# Patient Record
Sex: Female | Born: 1990 | Race: White | Hispanic: No | Marital: Single | Smoking: Never smoker
Health system: Southern US, Community
[De-identification: ages and names within clinical notes are randomized; demographics above are authoritative.]

## PROBLEM LIST (undated history)

## (undated) DIAGNOSIS — F419 Anxiety disorder, unspecified: Secondary | ICD-10-CM

## (undated) DIAGNOSIS — R519 Headache, unspecified: Secondary | ICD-10-CM

## (undated) DIAGNOSIS — F99 Mental disorder, not otherwise specified: Secondary | ICD-10-CM

## (undated) DIAGNOSIS — K5900 Constipation, unspecified: Secondary | ICD-10-CM

## (undated) HISTORY — DX: Anxiety disorder, unspecified: F41.9

## (undated) HISTORY — DX: Mental disorder, not otherwise specified: F99

## (undated) HISTORY — DX: Headache, unspecified: R51.9

---

## 2011-01-15 DIAGNOSIS — Z0000 Encounter for general adult medical examination without abnormal findings: Secondary | ICD-10-CM | POA: Insufficient documentation

## 2012-01-29 DIAGNOSIS — K219 Gastro-esophageal reflux disease without esophagitis: Secondary | ICD-10-CM | POA: Insufficient documentation

## 2013-11-20 DIAGNOSIS — L709 Acne, unspecified: Secondary | ICD-10-CM | POA: Insufficient documentation

## 2019-07-14 ENCOUNTER — Encounter: Payer: Self-pay | Admitting: Obstetrics and Gynecology

## 2020-04-01 ENCOUNTER — Emergency Department: Payer: Self-pay

## 2020-04-01 ENCOUNTER — Observation Stay
Admission: EM | Admit: 2020-04-01 | Discharge: 2020-04-02 | Disposition: A | Discharge status: Home / Self Care | Payer: Self-pay | Attending: Emergency Medicine | Admitting: Emergency Medicine

## 2020-04-01 ENCOUNTER — Other Ambulatory Visit: Payer: Self-pay

## 2020-04-01 DIAGNOSIS — Z20822 Contact with and (suspected) exposure to covid-19: Secondary | ICD-10-CM | POA: Insufficient documentation

## 2020-04-01 DIAGNOSIS — G43901 Migraine, unspecified, not intractable, with status migrainosus: Principal | ICD-10-CM

## 2020-04-01 DIAGNOSIS — F439 Reaction to severe stress, unspecified: Secondary | ICD-10-CM

## 2020-04-01 DIAGNOSIS — R519 Headache, unspecified: Secondary | ICD-10-CM

## 2020-04-01 DIAGNOSIS — R338 Other retention of urine: Secondary | ICD-10-CM

## 2020-04-01 DIAGNOSIS — R339 Retention of urine, unspecified: Secondary | ICD-10-CM | POA: Insufficient documentation

## 2020-04-01 LAB — COMPREHENSIVE METABOLIC PANEL
ALT: 27 U/L (ref 0–44)
AST: 20 U/L (ref 15–41)
Albumin: 4.3 g/dL (ref 3.5–5.0)
Alkaline Phosphatase: 92 U/L (ref 38–126)
Anion gap: 9 (ref 5–15)
BUN: 9 mg/dL (ref 6–20)
CO2: 25 mmol/L (ref 22–32)
Calcium: 9.3 mg/dL (ref 8.9–10.3)
Chloride: 102 mmol/L (ref 98–111)
Creatinine, Ser: 0.83 mg/dL (ref 0.44–1.00)
GFR, Estimated: >60 mL/min (ref >60)
Glucose, Bld: 92 mg/dL (ref 70–99)
Potassium: 3.6 mmol/L (ref 3.5–5.1)
Sodium: 136 mmol/L (ref 135–145)
Total Bilirubin: 0.7 mg/dL (ref 0.3–1.2)
Total Protein: 7.6 g/dL (ref 6.5–8.1)

## 2020-04-01 LAB — URINALYSIS, COMPLETE (UACMP) WITH MICROSCOPIC
Bacteria, UA: NONE SEEN
Bilirubin Urine: NEGATIVE
Glucose, UA: NEGATIVE mg/dL
Hgb urine dipstick: NEGATIVE
Ketones, ur: NEGATIVE mg/dL
Leukocytes,Ua: NEGATIVE
Nitrite: NEGATIVE
Protein, ur: NEGATIVE mg/dL
Specific Gravity, Urine: 1.014 (ref 1.005–1.030)
Squamous Epithelial / HPF: NONE SEEN (ref 0–5)
pH: 6 (ref 5.0–8.0)

## 2020-04-01 LAB — CBC WITH DIFFERENTIAL/PLATELET
Abs Immature Granulocytes: 0.03 10*3/uL (ref 0.00–0.07)
Basophils Absolute: 0 10*3/uL (ref 0.0–0.1)
Basophils Relative: 1 %
Eosinophils Absolute: 0.2 10*3/uL (ref 0.0–0.5)
Eosinophils Relative: 2 %
HCT: 38.4 % (ref 36.0–46.0)
Hemoglobin: 13.2 g/dL (ref 12.0–15.0)
Immature Granulocytes: 0 %
Lymphocytes Relative: 31 %
Lymphs Abs: 2.6 10*3/uL (ref 0.7–4.0)
MCH: 31.6 pg (ref 26.0–34.0)
MCHC: 34.4 g/dL (ref 30.0–36.0)
MCV: 91.9 fL (ref 80.0–100.0)
Monocytes Absolute: 0.8 10*3/uL (ref 0.1–1.0)
Monocytes Relative: 9 %
Neutro Abs: 4.9 10*3/uL (ref 1.7–7.7)
Neutrophils Relative %: 57 %
Platelets: 265 10*3/uL (ref 150–400)
RBC: 4.18 MIL/uL (ref 3.87–5.11)
RDW: 11.9 % (ref 11.5–15.5)
WBC: 8.6 10*3/uL (ref 4.0–10.5)
nRBC: 0 % (ref 0.0–0.2)

## 2020-04-01 LAB — RESP PANEL BY RT PCR (RSV, FLU A&B, COVID)
Influenza A by PCR: NEGATIVE
Influenza B by PCR: NEGATIVE
Respiratory Syncytial Virus by PCR: NEGATIVE
SARS Coronavirus 2 by RT PCR: NEGATIVE

## 2020-04-01 LAB — POCT PREGNANCY, URINE: Preg Test, Ur: NEGATIVE

## 2020-04-01 MED ORDER — SUMATRIPTAN SUCCINATE 50 MG PO TABS
50.0000 mg | ORAL_TABLET | Freq: Two times a day (BID) | ORAL | Status: DC | PRN
  Administered 2020-04-01: 50 mg via ORAL
  Filled 2020-04-01 (×3): qty 1

## 2020-04-01 MED ORDER — KETOROLAC TROMETHAMINE 30 MG/ML IJ SOLN: 30.0000 mg | Freq: Once | INTRAMUSCULAR | Status: DC

## 2020-04-01 MED ORDER — HYDROXYZINE HCL 25 MG PO TABS
50.0000 mg | ORAL_TABLET | Freq: Two times a day (BID) | ORAL | Status: DC | PRN
  Administered 2020-04-01: 50 mg via ORAL
  Filled 2020-04-01: qty 2

## 2020-04-01 MED ORDER — ENOXAPARIN SODIUM 40 MG/0.4ML ~~LOC~~ SOLN
40.0000 mg | SUBCUTANEOUS | Status: DC
  Administered 2020-04-02: 40 mg via SUBCUTANEOUS
  Filled 2020-04-01: qty 0.4

## 2020-04-01 MED ORDER — TRAMADOL HCL 50 MG PO TABS: 50.0000 mg | ORAL_TABLET | Freq: Three times a day (TID) | ORAL | Status: DC | PRN

## 2020-04-01 MED ORDER — DIPHENHYDRAMINE HCL 50 MG/ML IJ SOLN
25.0000 mg | Freq: Once | INTRAMUSCULAR | Status: AC
  Administered 2020-04-01: 25 mg via INTRAVENOUS
  Filled 2020-04-01: qty 1

## 2020-04-01 MED ORDER — ONDANSETRON HCL 4 MG/2ML IJ SOLN
4.0000 mg | Freq: Once | INTRAMUSCULAR | Status: AC
  Administered 2020-04-01: 4 mg via INTRAVENOUS
  Filled 2020-04-01: qty 2

## 2020-04-01 MED ORDER — PROMETHAZINE HCL 25 MG PO TABS
12.5000 mg | ORAL_TABLET | Freq: Four times a day (QID) | ORAL | Status: DC | PRN
  Administered 2020-04-01: 12.5 mg via ORAL
  Filled 2020-04-01: qty 1

## 2020-04-01 MED ORDER — SODIUM CHLORIDE 0.9 % IV BOLUS
500.0000 mL | Freq: Once | INTRAVENOUS | Status: AC
  Administered 2020-04-01: 500 mL via INTRAVENOUS

## 2020-04-01 MED ORDER — KETOROLAC TROMETHAMINE 30 MG/ML IJ SOLN
30.0000 mg | Freq: Once | INTRAMUSCULAR | Status: AC
  Administered 2020-04-01: 30 mg via INTRAVENOUS
  Filled 2020-04-01: qty 1

## 2020-04-01 MED ORDER — SUMATRIPTAN SUCCINATE 6 MG/0.5ML ~~LOC~~ SOLN
6.0000 mg | Freq: Once | SUBCUTANEOUS | Status: AC
  Administered 2020-04-01: 6 mg via SUBCUTANEOUS
  Filled 2020-04-01: qty 0.5

## 2020-04-01 MED ORDER — KETOROLAC TROMETHAMINE 30 MG/ML IJ SOLN
30.0000 mg | Freq: Four times a day (QID) | INTRAMUSCULAR | Status: DC | PRN
  Administered 2020-04-01: 30 mg via INTRAVENOUS
  Filled 2020-04-01 (×2): qty 1

## 2020-04-01 MED ORDER — DEXAMETHASONE SODIUM PHOSPHATE 10 MG/ML IJ SOLN
10.0000 mg | Freq: Once | INTRAMUSCULAR | Status: AC
  Administered 2020-04-01: 10 mg via INTRAVENOUS
  Filled 2020-04-01: qty 1

## 2020-04-01 NOTE — ED Notes (Signed)
BS for 469. Provider verbal Foley placement.

## 2020-04-01 NOTE — H&P (Signed)
History and Physical    Kimberly Hardy VOH:[id number] DOB: 25-Jun-1990 DOA: 04/01/2020  PCP: Patient, No Pcp Per   Patient coming from: Home  I have personally briefly reviewed patient's old medical records in [facility]  Chief Complaint: Headache x2 days, difficulty voiding x1 week, prolonged menstruation HPI: Kimberly Hardy is a 29 y.o. female with no significant past medical history who presents to the emergency room with a 2-day history of headache of moderate severe intensity.  Patient has no prior history of headaches.  Headache is described as spasms that are periodic and severe lasting no more than 2 minutes affecting her entire head, not one-sided and associated with photo and phonophobia.  Has associated nausea.  Has no visual disturbance..  Patient has tried several over-the-counter medications as well as unknown migraine medication without significant improvement.  Gets relief from topical ice packs.   Additionally, patient has been having difficulty voiding associated with lower abdominal pain for the past 1 week and was seen at an emergency room earlier in the week where she was diagnosed with a UTI.  She said those symptoms were preceded by an abnormal menstrual cycle which lasted 9 days whereas her usual cycle lasts just 4 days.  Patient endorses several recent life stressors in her personal life.  ED Course: On arrival in the emergency room, vitals were within normal limits and initial blood work unremarkable urinalysis clear.  Patient was tried with sumatriptan, dexamethasone Benadryl and Toradol but her headache continued to wax and wane during several hours of observation.  She subsequently had an MRI of the brain that showed no acute findings.  A bladder scan showed 900 mm and patient had an anal catheterization.  She had IV fluid boluses and after several hours she failed a voiding trial and had a Foley catheter placed.  Subsequently had MRI lumbar spine was within normal  limits. EKG as reviewed by me : Normal sinus rhythm rate of 78 Hospitalist consulted for admission due to failed voiding trial and persistent headache  Review of Systems: As per HPI otherwise all other systems on review of systems negative.    History reviewed. No pertinent past medical history.  History reviewed. No pertinent surgical history.   reports that she has never smoked. She does not have any smokeless tobacco history on file. No history on file for alcohol use and drug use.  Allergies  Allergen Reactions  . Oxycodone-Acetaminophen Nausea Only    History reviewed. No pertinent family history.    Prior to Admission medications   Not on File    Physical Exam: Vitals:   04/01/20 1141 04/01/20 1339 04/01/20 1425 04/01/20 1823  BP: 99/71  111/80 110/76  Pulse: (!) 102 82 72 82  Resp: (!) 22 17 19 18   Temp: 97.9 F (36.6 C)   98.8 F (37.1 C)  TempSrc: Oral   Oral  SpO2: 96% 100% 100% 98%  Weight: 68.9 kg     Height: 5\' 6"  (1.676 m)        Vitals:   04/01/20 1141 04/01/20 1339 04/01/20 1425 04/01/20 1823  BP: 99/71  111/80 110/76  Pulse: (!) 102 82 72 82  Resp: (!) 22 17 19 18   Temp: 97.9 F (36.6 C)   98.8 F (37.1 C)  TempSrc: Oral   Oral  SpO2: 96% 100% 100% 98%  Weight: 68.9 kg     Height: 5\' 6"  (1.676 m)         Constitutional: Alert and  oriented x 3 . Not in any apparent distress HEENT:      Head: Normocephalic and atraumatic.         Eyes: PERLA, EOMI, Conjunctivae are normal. Sclera is non-icteric.       Mouth/Throat: Mucous membranes are moist.       Neck: Supple with no signs of meningismus. Cardiovascular: Regular rate and rhythm. No murmurs, gallops, or rubs. 2+ symmetrical distal pulses are present . No JVD. No LE edema Respiratory: Respiratory effort normal .Lungs sounds clear bilaterally. No wheezes, crackles, or rhonchi.  Gastrointestinal: Soft, non tender, and non distended with positive bowel sounds. No rebound or  guarding. Genitourinary: No CVA tenderness.  Foley catheter Musculoskeletal: Nontender with normal range of motion in all extremities. No cyanosis, or erythema of extremities. Neurologic:  Face is symmetric. Moving all extremities. No gross focal neurologic deficits . Skin: Skin is warm, dry.  No rash or ulcers Psychiatric: Mood and affect are normal    Labs on Admission: I have personally reviewed following labs and imaging studies  CBC: Recent Labs  Lab 04/01/20 1319  WBC 8.6  NEUTROABS 4.9  HGB 13.2  HCT 38.4  MCV 91.9  PLT 265   Basic Metabolic Panel: Recent Labs  Lab 04/01/20 1319  NA 136  K 3.6  CL 102  CO2 25  GLUCOSE 92  BUN 9  CREATININE 0.83  CALCIUM 9.3   GFR: Estimated Creatinine Clearance: 94.5 mL/min (by C-G formula based on SCr of 0.83 mg/dL). Liver Function Tests: Recent Labs  Lab 04/01/20 1319  AST 20  ALT 27  ALKPHOS 92  BILITOT 0.7  PROT 7.6  ALBUMIN 4.3   No results for input(s): LIPASE, AMYLASE in the last 168 hours. No results for input(s): AMMONIA in the last 168 hours. Coagulation Profile: No results for input(s): INR, PROTIME in the last 168 hours. Cardiac Enzymes: No results for input(s): CKTOTAL, CKMB, CKMBINDEX, TROPONINI in the last 168 hours. BNP (last 3 results) No results for input(s): PROBNP in the last 8760 hours. HbA1C: No results for input(s): HGBA1C in the last 72 hours. CBG: No results for input(s): GLUCAP in the last 168 hours. Lipid Profile: No results for input(s): CHOL, HDL, LDLCALC, TRIG, CHOLHDL, LDLDIRECT in the last 72 hours. Thyroid Function Tests: No results for input(s): TSH, T4TOTAL, FREET4, T3FREE, THYROIDAB in the last 72 hours. Anemia Panel: No results for input(s): VITAMINB12, FOLATE, FERRITIN, TIBC, IRON, RETICCTPCT in the last 72 hours. Urine analysis:    Component Value Date/Time   COLORURINE YELLOW (A) 04/01/2020 1319   APPEARANCEUR CLEAR (A) 04/01/2020 1319   LABSPEC 1.014 04/01/2020 1319    PHURINE 6.0 04/01/2020 1319   GLUCOSEU NEGATIVE 04/01/2020 1319   HGBUR NEGATIVE 04/01/2020 1319   BILIRUBINUR NEGATIVE 04/01/2020 1319   KETONESUR NEGATIVE 04/01/2020 1319   PROTEINUR NEGATIVE 04/01/2020 1319   NITRITE NEGATIVE 04/01/2020 1319   LEUKOCYTESUR NEGATIVE 04/01/2020 1319    Radiological Exams on Admission: CT Head Wo Contrast  Result Date: 04/01/2020 CLINICAL DATA:  Headache EXAM: CT HEAD WITHOUT CONTRAST TECHNIQUE: Contiguous axial images were obtained from the base of the skull through the vertex without intravenous contrast. COMPARISON:  None. FINDINGS: Brain: No evidence of acute infarction, hemorrhage, hydrocephalus, extra-axial collection or mass lesion/mass effect. Vascular: Negative for hyperdense vessel Skull: Negative Sinuses/Orbits: Paranasal sinuses clear.  Negative orbit. Other: None IMPRESSION: Negative CT head Electronically Signed   By: Marlan Palauharles  Clark M.D.   On: 04/01/2020 14:07   MR BRAIN WO CONTRAST  Result Date: 04/01/2020 CLINICAL DATA:  29 year old female with headache, migraine since yesterday. Pain radiates from the back to the front with photophobia, phonophobia. Low back pain, urinary retention. EXAM: MRI HEAD WITHOUT CONTRAST TECHNIQUE: Multiplanar, multiecho pulse sequences of the brain and surrounding structures were obtained without intravenous contrast. COMPARISON:  Head CT earlier today. FINDINGS: Brain: Normal cerebral volume. No restricted diffusion to suggest acute infarction. No midline shift, mass effect, evidence of mass lesion, ventriculomegaly, extra-axial collection or acute intracranial hemorrhage. Cervicomedullary junction and pituitary are within normal limits. Mild susceptibility artifact from earrings. Allowing for this, gray and white matter signal throughout the brain is within normal limits. Vascular: Major intracranial vascular flow voids are preserved. Skull and upper cervical spine: Negative visible cervical spine. Normal bone  marrow signal. Sinuses/Orbits: Negative. Other: Mastoids are clear. Visible internal auditory structures appear normal. Scalp and face soft tissues appear negative. IMPRESSION: Normal MRI appearance of the brain. Electronically Signed   By: Odessa Fleming M.D.   On: 04/01/2020 17:39   MR LUMBAR SPINE WO CONTRAST  Result Date: 04/01/2020 CLINICAL DATA:  29 year old female with headache, migraine since yesterday. Pain radiates from the back to the front with photophobia, phonophobia. Low back pain, urinary retention. EXAM: MRI LUMBAR SPINE WITHOUT CONTRAST TECHNIQUE: Multiplanar, multisequence MR imaging of the lumbar spine was performed. No intravenous contrast was administered. COMPARISON:  None. FINDINGS: Segmentation: Lumbar segmentation appears to be normal and will be designated as such for this report. Alignment: Subtle levoconvex lumbar scoliosis. Mild straightening of lordosis. No spondylolisthesis. Vertebrae: No marrow edema or evidence of acute osseous abnormality. Visualized bone marrow signal is within normal limits. Intact visible sacrum and SI joints. Conus medullaris and cauda equina: Conus extends to the T12-L1 level. No lower spinal cord or conus signal abnormality. Capacious spinal canal. Normal cauda equina. Paraspinal and other soft tissues: Negative. Disc levels: T11-T12: Mild facet hypertrophy on the left.  Otherwise negative. T12-L1:  Negative. L1-L2:  Negative. L2-L3:  Negative. L3-L4:  Negative. L4-L5: Borderline to mild facet and ligament flavum hypertrophy. Otherwise negative. L5-S1:  Negative. IMPRESSION: Essentially normal MRI appearance of the lumbar spine. No spinal stenosis or neural impingement. Electronically Signed   By: Odessa Fleming M.D.   On: 04/01/2020 17:42     Assessment/Plan 29 year old female with no significant past medical history but with recent life stressors presenting with 2-day history of intractable headache and 1 week history of difficulty voiding    Intractable  headache -No prior history of headache but with recent stressors -Headache description with some typical but mostly atypical migraine features -MRI brain with no acute findings -Patient treated empirically for migraine in the emergency room with an adequate response -Continue Toradol, Phenergan for now, Imitrex as needed -Consider neurology consult in the morning    Acute urinary retention -Patient presents with 1 week history of difficulty voiding and acute urinary retention with residual urine of 900 mL, failing voiding trial in the emergency room several hours after in and out cath. -States her urinary symptoms were preceded by an abnormal menstrual cycle -Etiology uncertain -Continue Foley catheter overnight -Consider urology/gynecology consult/pelvic ultrasound if failing voiding trial in the a.m.    Psychological stress -Recent changes in relationship, job -Might benefit from inpatient or outpatient referral to behavioral services    DVT prophylaxis: Lovenox  Code Status: full code  Family Communication:  none  Disposition Plan: Back to previous home environment Consults called: none  Status:At the time of admission, it appears that the  appropriate admission status for this patient is INPATIENT. This is judged to be reasonable and necessary in order to provide the required intensity of service to ensure the patient's safety given the presenting symptoms, physical exam findings, and initial radiographic and laboratory data in the context of their  Comorbid conditions.   Patient requires inpatient status due to high intensity of service, high risk for further deterioration and high frequency of surveillance required.   I certify that at the point of admission it is my clinical judgment that the patient will require inpatient hospital care spanning beyond 2 midnights     Andris Baumann MD [facility]     04/01/2020, 7:58 PM

## 2020-04-01 NOTE — ED Notes (Signed)
Pt taken to MRI at this time

## 2020-04-01 NOTE — ED Triage Notes (Signed)
Reports "migraine" since yesterday afternoon. Reports pain starts in back and radiates front. Sensitivity to light, noise. Denies NV. Pt tearful. Has taken OTC without relief.

## 2020-04-01 NOTE — ED Notes (Signed)
Pt c/o headache x3-4 days. Pt states pain is localized to right occipital area. Pt denies N/V/D. Mother states pt has been seen by a neurologist in the past for headaches, but thinks it may be just allergies.

## 2020-04-01 NOTE — ED Provider Notes (Signed)
[facility] Emergency Department Provider Note  ____________________________________________  Time seen: Approximately 2:36 PM  I have reviewed the triage vital signs and the nursing notes.   HISTORY  Chief Complaint Migraine    HPI Kimberly Hardy is a 29 y.o. female that presents to the emergency department for evaluation of migraine, light sensitivity, nausea for 2 days.  Patient also states that she has been unable to urinate since last night around midnight.  She reports that she has been unable to fully empty her bladder for about 1 week but has been urinating some until last night.  She has had some episodes of "jolting" back pain that runs up her spine.  She took a friend's migraine medication but cannot recall the name.  Her ex-boyfriend also gave her a Valium.  She also took Aleve and Tylenol.  She does not usually get migraines.  She denies any recent illness. No known fever. No neck pain. No IV drug use. No saddle anesthesia.   Patient went to [facility] a couple of days ago to be evaluated for low back pain, buttocks pain, bilateral leg pain, and dysuria.  She was diagnosed with a urinary tract infection and sent home on Keflex.  She only took 1 dose of the Keflex.  She tested negative for STI.  Mother called and reports that she is concerned that patient is having a nervous breakdown.  Patient recently broke up with her boyfriend, lost her job and moved.  History reviewed. No pertinent past medical history.  Patient Active Problem List   Diagnosis Date Noted  . Intractable headache 04/01/2020  . Acute urinary retention 04/01/2020  . Psychological stress 04/01/2020    History reviewed. No pertinent surgical history.  Prior to Admission medications   Medication Sig Start Date End Date Taking? Authorizing Provider  amphetamine-dextroamphetamine (ADDERALL) 20 MG tablet Take 20 mg by mouth 2 (two) times daily.   Yes [provider]   citalopram (CELEXA) 40 MG tablet Take 40 mg by mouth at bedtime.   Yes [provider]  hydrOXYzine (VISTARIL) 50 MG capsule Take 50 mg by mouth 2 (two) times daily as needed for anxiety (panic attacks).   Yes [provider]  PRESCRIPTION MEDICATION Take by mouth 2 (two) times daily.   Yes [provider]    Allergies Oxycodone-acetaminophen  No family history on file.  Social History Social History   Tobacco Use  . Smoking status: Never Smoker  Substance Use Topics  . Alcohol use: Not on file  . Drug use: Not on file     Review of Systems  Constitutional: No fever/chills ENT: No upper respiratory complaints. Cardiovascular: No chest pain. Respiratory: No cough. No SOB. Gastrointestinal: No abdominal pain.  No nausea, no vomiting.  Genitourinary: Negative for dysuria. Musculoskeletal: Negative for musculoskeletal pain. Skin: Negative for rash, abrasions, lacerations, ecchymosis. Neurological: Negative for numbness or tingling. Positive for headache.   ____________________________________________   PHYSICAL EXAM:  VITAL SIGNS: ED Triage Vitals [04/01/20 1141]  Enc Vitals Group     BP 99/71     Pulse Rate (!) 102     Resp (!) 22     Temp 97.9 F (36.6 C)     Temp Source Oral     SpO2 96 %     Weight 152 lb (68.9 kg)     Height 5\' 6"  (1.676 m)     Head Circumference      Peak Flow  Pain Score 10     Pain Loc      Pain Edu?      Excl. in GC?      Constitutional: Alert and oriented.  Anxious Eyes: Conjunctivae are normal. PERRL. EOMI. Head: Atraumatic. ENT:      Ears:      Nose: No congestion/rhinnorhea.      Mouth/Throat: Mucous membranes are moist.  Neck: No stridor.  No cervical spine tenderness to palpation.  Full range of motion of neck without pain. Cardiovascular: Normal rate, regular rhythm.  Good peripheral circulation. Respiratory: Normal respiratory effort without tachypnea or retractions. Lungs CTAB. Good air  entry to the bases with no decreased or absent breath sounds. Gastrointestinal: Bowel sounds 4 quadrants. Soft and nontender to palpation. No guarding or rigidity. No palpable masses. No distention.  Musculoskeletal: Full range of motion to all extremities. No gross deformities appreciated. Neurologic: Normal speech and language. No gross focal neurologic deficits are appreciated.  Cranial nerves: 2-10 normal as tested. Strength 5/5 in upper and lower extremities Cerebellar: Finger-nose-finger WNL, Heel to shin WNL Sensorimotor: No pronator drift, clonus, sensory loss or abnormal reflexes. No vision deficits noted to confrontation bilaterally.  Speech: No dysarthria or expressive aphasia Skin:  Skin is warm, dry and intact. No rash noted. Psychiatric: Mood and affect are normal. Speech and behavior are normal. Patient exhibits appropriate insight and judgement.   ____________________________________________   LABS (all labs ordered are listed, but only abnormal results are displayed)  Labs Reviewed  URINALYSIS, COMPLETE (UACMP) WITH MICROSCOPIC - Abnormal; Notable for the following components:      Result Value   Color, Urine YELLOW (*)    APPearance CLEAR (*)    All other components within normal limits  RESP PANEL BY RT PCR (RSV, FLU A&B, COVID)  CBC WITH DIFFERENTIAL/PLATELET  COMPREHENSIVE METABOLIC PANEL  HIV ANTIBODY (ROUTINE TESTING W REFLEX)  CBC  CREATININE, SERUM  POC URINE PREG, ED  POCT PREGNANCY, URINE   ____________________________________________  EKG   ____________________________________________  RADIOLOGY Lexine Baton, personally viewed and evaluated these images (plain radiographs) as part of my medical decision making, as well as reviewing the written report by the radiologist.  CT Head Wo Contrast  Result Date: 04/01/2020 CLINICAL DATA:  Headache EXAM: CT HEAD WITHOUT CONTRAST TECHNIQUE: Contiguous axial images were obtained from the base of the  skull through the vertex without intravenous contrast. COMPARISON:  None. FINDINGS: Brain: No evidence of acute infarction, hemorrhage, hydrocephalus, extra-axial collection or mass lesion/mass effect. Vascular: Negative for hyperdense vessel Skull: Negative Sinuses/Orbits: Paranasal sinuses clear.  Negative orbit. Other: None IMPRESSION: Negative CT head Electronically Signed   By: Marlan Palau M.D.   On: 04/01/2020 14:07   MR BRAIN WO CONTRAST  Result Date: 04/01/2020 CLINICAL DATA:  29 year old female with headache, migraine since yesterday. Pain radiates from the back to the front with photophobia, phonophobia. Low back pain, urinary retention. EXAM: MRI HEAD WITHOUT CONTRAST TECHNIQUE: Multiplanar, multiecho pulse sequences of the brain and surrounding structures were obtained without intravenous contrast. COMPARISON:  Head CT earlier today. FINDINGS: Brain: Normal cerebral volume. No restricted diffusion to suggest acute infarction. No midline shift, mass effect, evidence of mass lesion, ventriculomegaly, extra-axial collection or acute intracranial hemorrhage. Cervicomedullary junction and pituitary are within normal limits. Mild susceptibility artifact from earrings. Allowing for this, gray and white matter signal throughout the brain is within normal limits. Vascular: Major intracranial vascular flow voids are preserved. Skull and upper cervical spine: Negative visible cervical  spine. Normal bone marrow signal. Sinuses/Orbits: Negative. Other: Mastoids are clear. Visible internal auditory structures appear normal. Scalp and face soft tissues appear negative. IMPRESSION: Normal MRI appearance of the brain. Electronically Signed   By: Odessa Fleming M.D.   On: 04/01/2020 17:39   MR LUMBAR SPINE WO CONTRAST  Result Date: 04/01/2020 CLINICAL DATA:  29 year old female with headache, migraine since yesterday. Pain radiates from the back to the front with photophobia, phonophobia. Low back pain, urinary  retention. EXAM: MRI LUMBAR SPINE WITHOUT CONTRAST TECHNIQUE: Multiplanar, multisequence MR imaging of the lumbar spine was performed. No intravenous contrast was administered. COMPARISON:  None. FINDINGS: Segmentation: Lumbar segmentation appears to be normal and will be designated as such for this report. Alignment: Subtle levoconvex lumbar scoliosis. Mild straightening of lordosis. No spondylolisthesis. Vertebrae: No marrow edema or evidence of acute osseous abnormality. Visualized bone marrow signal is within normal limits. Intact visible sacrum and SI joints. Conus medullaris and cauda equina: Conus extends to the T12-L1 level. No lower spinal cord or conus signal abnormality. Capacious spinal canal. Normal cauda equina. Paraspinal and other soft tissues: Negative. Disc levels: T11-T12: Mild facet hypertrophy on the left.  Otherwise negative. T12-L1:  Negative. L1-L2:  Negative. L2-L3:  Negative. L3-L4:  Negative. L4-L5: Borderline to mild facet and ligament flavum hypertrophy. Otherwise negative. L5-S1:  Negative. IMPRESSION: Essentially normal MRI appearance of the lumbar spine. No spinal stenosis or neural impingement. Electronically Signed   By: Odessa Fleming M.D.   On: 04/01/2020 17:42    ____________________________________________    PROCEDURES  Procedure(s) performed:    Procedures    Medications  enoxaparin (LOVENOX) injection 40 mg (has no administration in time range)  ketorolac (TORADOL) 30 MG/ML injection 30 mg (has no administration in time range)  promethazine (PHENERGAN) tablet 12.5 mg (has no administration in time range)  traMADol (ULTRAM) tablet 50 mg (has no administration in time range)  sodium chloride 0.9 % bolus 500 mL (0 mLs Intravenous Stopped 04/01/20 1415)  ondansetron (ZOFRAN) injection 4 mg (4 mg Intravenous Given 04/01/20 1321)  diphenhydrAMINE (BENADRYL) injection 25 mg (25 mg Intravenous Given 04/01/20 1321)  ketorolac (TORADOL) 30 MG/ML injection 30 mg (30 mg  Intravenous Given 04/01/20 1421)  sodium chloride 0.9 % bolus 500 mL (0 mLs Intravenous Stopped 04/01/20 1650)  SUMAtriptan (IMITREX) injection 6 mg (6 mg Subcutaneous Given 04/01/20 1608)  dexamethasone (DECADRON) injection 10 mg (10 mg Intravenous Given 04/01/20 1819)     ____________________________________________   INITIAL IMPRESSION / ASSESSMENT AND PLAN / ED COURSE  Pertinent labs & imaging results that were available during my care of the patient were reviewed by me and considered in my medical decision making (see chart for details).  Review of the [place] CSRS was performed in accordance of the [facility] prior to dispensing any controlled drugs.    Patient presented to the emergency department for evaluation of migraine.  Patient also states that she has been unable to urinate since last night.  Vital signs and exam are reassuring.   ----------------------------------------- 1:00 PM on 04/01/2020 -----------------------------------------  CT, lab work, urinalysis were ordered for further evaluation.  Neuro exam is within normal limits.  Migraine cocktail and fluids were ordered for symptom relief.  Patient states that she is unable to urinate.  900cc was noted on bladder scan.  In and out cath performed and urine sent to lab for urinalysis.  Patient states that she was recently diagnosed with a UTI but has only taken 1 dose of the  Keflex.   ----------------------------------------- 3:00 PM on 04/01/2020 -----------------------------------------  CBC, CMP are unremarkable.  No indication of infection on urinalysis.  Covid test is negative.  CT scan is negative for acute intercranial abnormality.  ----------------------------------------- 4:00 PM on 04/01/2020 -----------------------------------------  Patient states that migraine is mildly improving following Toradol.  Dr. Vicente MalesBradler has been in to evaluate the patient and recommends MRI brain and MRI  lumbar.  ----------------------------------------- 5:55 PM on 04/01/2020 -----------------------------------------  Migraine is worsening following MRI.  MRI brain and lumbar are unremarkable.  Patient was given IV Decadron.  ----------------------------------------- 7:00 PM on 04/01/2020 -----------------------------------------  Patient states that her migraine is not improving.  She does not feel well enough to go home.  She still feels that she needs to urinate but has still been unable to urinate on her own.  Foley catheter was placed.     Case was discussed with Dr. Vicente MalesBradler, who is in agreement with admission for symptom control and observation.  Hospitalist Dr. Para Marchuncan is agreeable with admission.      Evelina BucyHeather Bramble was evaluated in Emergency Department on 04/01/2020 for the symptoms described in the history of present illness. She was evaluated in the context of the global COVID-19 pandemic, which necessitated consideration that the patient might be at risk for infection with the SARS-CoV-2 virus that causes COVID-19. Institutional protocols and algorithms that pertain to the evaluation of patients at risk for COVID-19 are in a state of rapid change based on information released by regulatory bodies including the CDC and federal and state organizations. These policies and algorithms were followed during the patient's care in the ED.  ____________________________________________  FINAL CLINICAL IMPRESSION(S) / ED DIAGNOSES  Final diagnoses:  Status migrainosus  Urinary retention  Stress      NEW MEDICATIONS STARTED DURING THIS VISIT:  ED Discharge Orders    None          This chart was dictated using voice recognition software/Dragon. Despite best efforts to proofread, errors can occur which can change the meaning. Any change was purely unintentional.    Enid DerryWagner, Kylah Maresh, PA-C 04/01/20 2015    Merwyn KatosBradler, Evan K, MD 04/02/20 Izell [id number]

## 2020-04-02 ENCOUNTER — Encounter: Payer: Self-pay | Admitting: Internal Medicine

## 2020-04-02 DIAGNOSIS — R338 Other retention of urine: Secondary | ICD-10-CM

## 2020-04-02 DIAGNOSIS — G43901 Migraine, unspecified, not intractable, with status migrainosus: Principal | ICD-10-CM

## 2020-04-02 LAB — HIV ANTIBODY (ROUTINE TESTING W REFLEX): HIV Screen 4th Generation wRfx: NONREACTIVE

## 2020-04-02 MED ORDER — TAMSULOSIN HCL 0.4 MG PO CAPS
0.4000 mg | ORAL_CAPSULE | Freq: Every day | ORAL | Status: DC
  Administered 2020-04-02: 0.4 mg via ORAL
  Filled 2020-04-02: qty 1

## 2020-04-02 MED ORDER — MAGNESIUM SULFATE 2 GM/50ML IV SOLN
2.0000 g | Freq: Once | INTRAVENOUS | Status: AC
  Administered 2020-04-02: 2 g via INTRAVENOUS
  Filled 2020-04-02: qty 50

## 2020-04-02 MED ORDER — CHLORHEXIDINE GLUCONATE CLOTH 2 % EX PADS
6.0000 | MEDICATED_PAD | Freq: Every day | CUTANEOUS | Status: DC
  Administered 2020-04-02: 6 via TOPICAL

## 2020-04-02 MED ORDER — DEXAMETHASONE SODIUM PHOSPHATE 10 MG/ML IJ SOLN
10.0000 mg | Freq: Once | INTRAMUSCULAR | Status: AC
  Administered 2020-04-02: 10 mg via INTRAVENOUS
  Filled 2020-04-02: qty 1

## 2020-04-02 MED ORDER — AMPHETAMINE-DEXTROAMPHETAMINE 5 MG PO TABS: 20.0000 mg | ORAL_TABLET | Freq: Two times a day (BID) | ORAL | Status: DC

## 2020-04-02 MED ORDER — TAMSULOSIN HCL 0.4 MG PO CAPS
0.4000 mg | ORAL_CAPSULE | Freq: Every day | ORAL | 0 refills | Status: DC
Start: 2020-04-03 — End: 2023-04-15

## 2020-04-02 MED ORDER — CITALOPRAM HYDROBROMIDE 20 MG PO TABS
40.0000 mg | ORAL_TABLET | Freq: Every day | ORAL | Status: DC
  Administered 2020-04-02: 40 mg via ORAL
  Filled 2020-04-02: qty 2

## 2020-04-02 NOTE — Discharge Instructions (Signed)
Acute Urinary Retention, Female  Acute urinary retention means that you cannot pee (urinate) at all, or that you pee too little and your bladder is not emptied completely. If it is not treated, it can lead to kidney damage or other serious problems. Follow these instructions at home:  Take over-the-counter and prescription medicines only as told by your doctor. Ask your doctor what medicines you should stay away from. Do not take any medicine unless your doctor says it is okay to do so.  If you were sent home with a tube that drains pee from the bladder (catheter), take care of it as told by your doctor.  Drink enough fluid to keep your pee clear or pale yellow.  If you were given an antibiotic, take it as told by your doctor. Do not stop taking the antibiotic even if you start to feel better.  Do not use any products that contain nicotine or tobacco, such as cigarettes and e-cigarettes. If you need help quitting, ask your doctor.  Watch for changes in your symptoms. Tell your doctor about them.  If told, keep track of any changes in your blood pressure at home. Tell your doctor about them.  Keep all follow-up visits as told by your doctor. This is important. Contact a doctor if:  You have spasms or you leak pee when you have spasms. Get help right away if:  You have chills or a fever.  You have blood in your pee.  You have a tube that drains the bladder and: ? The tube stops draining pee. ? The tube falls out. Summary  Acute urinary retention means that you cannot pee at all, or that you pee too little and your bladder is not emptied completely. If it is not treated, it can result in kidney damage or other serious problems.  If you were sent home with a tube that drains pee from the bladder, take care of it as told by your doctor.  Pay attention to any changes in your symptoms. Tell your doctor about them. This information is not intended to replace advice given to you by your  health care provider. Make sure you discuss any questions you have with your health care provider. Document Revised: 05/17/2017 Document Reviewed: 07/06/2016 Elsevier Patient Education  2020 [employer]  

## 2020-04-02 NOTE — Progress Notes (Signed)
Patient being discharged with Foley per Dr. Renae Gloss. Education given to patient regarding care and emptying of Foley at home. Leg bag applied to patient and extra supplies given. Patient verbalized understanding. No further questions or concerns at this time.   Luvenia Starch, RN

## 2020-04-02 NOTE — Discharge Summary (Signed)
[facility] [place] [facility]   PATIENT NAME: Kimberly Hardy    MR#:  [id number]  DATE OF BIRTH:  June 24, 1990  DATE OF ADMISSION:  04/01/2020 ADMITTING PHYSICIAN: Andris Baumann, MD  DATE OF DISCHARGE: 04/02/2020  3:16 PM  PRIMARY CARE PHYSICIAN: Patient, No Pcp Per    ADMISSION DIAGNOSIS:  Urinary retention [R33.9] Stress [F43.9] Status migrainosus [G43.901] Intractable headache [R51.9]  DISCHARGE DIAGNOSIS:  Migraine headache Acute urinary retention  SECONDARY DIAGNOSIS:  History reviewed. No pertinent past medical history.  HOSPITAL COURSE:   1.  Migraine headache.  ER patient received Phenergan Imitrex and Decadron and as needed Toradol.  I repeated Decadron and gave IV magnesium on the day of discharge.  Headache improved.  Patient has been under a lot of stress recently.  Patient had a CT scan and an MRI of the brain which was negative 2.  Acute urinary retention.  Patient had 900 mL in her bladder and a Foley catheter was placed.  Case discussed with Dr. Annabell Howells urology and he recommended the Foley catheter stay in upon discharge and follow-up as outpatient for voiding trial and urodynamic studies.  He reviewed radiological studies from [facility].  He recommended starting Flomax.  Nursing staff to teach patient about emptying urine from leg bag.  Patient states that she was diagnosed with a urinary tract infection at a different hospital.  Her urine analysis is negative.  She can complete the antibiotics.  Patient had an MRI of the lumbar spine which did not show any acute reason for her urinary retention. 3.  Anxiety continue Celexa  DISCHARGE CONDITIONS:   Satisfactory  CONSULTS OBTAINED:  Case discussed with urology Dr. Annabell Howells  DRUG ALLERGIES:   Allergies  Allergen Reactions  . Oxycodone-Acetaminophen Nausea Only    Not a true allergy, Pt doesn't like the side effects    DISCHARGE MEDICATIONS:   Allergies as of 04/02/2020      Reactions    Oxycodone-acetaminophen Nausea Only   Not a true allergy, Pt doesn't like the side effects      Medication List    TAKE these medications   amphetamine-dextroamphetamine 20 MG tablet Commonly known as: ADDERALL Take 20 mg by mouth 2 (two) times daily.   citalopram 40 MG tablet Commonly known as: CELEXA Take 40 mg by mouth at bedtime.   hydrOXYzine 50 MG capsule Commonly known as: VISTARIL Take 50 mg by mouth 2 (two) times daily as needed for anxiety (panic attacks).   PRESCRIPTION MEDICATION Take by mouth 2 (two) times daily.   tamsulosin 0.4 MG Caps capsule Commonly known as: FLOMAX Take 1 capsule (0.4 mg total) by mouth daily. Start taking on: April 03, 2020        DISCHARGE INSTRUCTIONS:   [facility] Follow-up with urology to remove Foley.  Dr. Annabell Howells had messaged his office to give her a call to set up an appointment.  If she does not receive a call from them and she can call the urology office.  If you experience worsening of your admission symptoms, develop shortness of breath, life threatening emergency, suicidal or homicidal thoughts you must seek medical attention immediately by calling 911 or calling your MD immediately  if symptoms less severe.  You Must read complete instructions/literature along with all the possible adverse reactions/side effects for all the Medicines you take and that have been prescribed to you. Take any new Medicines after you have completely understood and  accept all the possible adverse reactions/side effects.   Please note  You were cared for by a hospitalist during your hospital stay. If you have any questions about your discharge medications or the care you received while you were in the hospital after you are discharged, you can call the unit and asked to speak with the hospitalist on call if the hospitalist that took care of you is not available. Once you are discharged, your primary care physician  will handle any further medical issues. Please note that NO REFILLS for any discharge medications will be authorized once you are discharged, as it is imperative that you return to your primary care physician (or establish a relationship with a primary care physician if you do not have one) for your aftercare needs so that they can reassess your need for medications and monitor your lab values.    Today   CHIEF COMPLAINT:   Chief Complaint  Patient presents with  . Migraine    HISTORY OF PRESENT ILLNESS:  Kimberly Hardy  is a 29 y.o. female came in with headache and difficulty urinating   VITAL SIGNS:  Blood pressure 104/64, pulse 79, temperature 97.8 F (36.6 C), resp. rate 18, height 5\' 6"  (1.676 m), weight 72.5 kg, last menstrual period 03/29/2020, SpO2 100 %.  I/O:    Intake/Output Summary (Last 24 hours) at 04/02/2020 1554 Last data filed at 04/02/2020 1013 Gross per 24 hour  Intake 1550 ml  Output 600 ml  Net 950 ml    PHYSICAL EXAMINATION:  GENERAL:  29 y.o.-year-old patient lying in the bed with no acute distress.  EYES: Pupils equal, round, reactive to light and accommodation. No scleral icterus. HEENT: Head atraumatic, normocephalic. Oropharynx and nasopharynx clear.   LUNGS: Normal breath sounds bilaterally, no wheezing, rales,rhonchi or crepitation. No use of accessory muscles of respiration.  CARDIOVASCULAR: S1, S2 normal. No murmurs, rubs, or gallops.  ABDOMEN: Soft, non-tender, non-distended.  EXTREMITIES: No pedal edema.  NEUROLOGIC: Cranial nerves II through XII are intact. Muscle strength 5/5 in all extremities. Sensation intact. Gait not checked.  PSYCHIATRIC: The patient is alert and oriented x 3.  SKIN: No obvious rash, lesion, or ulcer.   DATA REVIEW:   CBC Recent Labs  Lab 04/01/20 1319  WBC 8.6  HGB 13.2  HCT 38.4  PLT 265    Chemistries  Recent Labs  Lab 04/01/20 1319  NA 136  K 3.6  CL 102  CO2 25  GLUCOSE 92  BUN 9   CREATININE 0.83  CALCIUM 9.3  AST 20  ALT 27  ALKPHOS 92  BILITOT 0.7    Microbiology Results  Results for orders placed or performed during the hospital encounter of 04/01/20  Resp Panel by RT PCR (RSV, Flu A&B, Covid) - Nasopharyngeal Swab     Status: None   Collection Time: 04/01/20  1:19 PM   Specimen: Nasopharyngeal Swab  Result Value Ref Range Status   SARS Coronavirus 2 by RT PCR NEGATIVE NEGATIVE Final    Comment: (NOTE) SARS-CoV-2 target nucleic acids are NOT DETECTED.  The SARS-CoV-2 RNA is generally detectable in upper respiratoy specimens during the acute phase of infection. The lowest concentration of SARS-CoV-2 viral copies this assay can detect is 131 copies/mL. A negative result does not preclude SARS-Cov-2 infection and should not be used as the sole basis for treatment or other patient management decisions. A negative result may occur with  improper specimen collection/handling, submission of specimen other than nasopharyngeal swab, presence  of viral mutation(s) within the areas targeted by this assay, and inadequate number of viral copies (<131 copies/mL). A negative result must be combined with clinical observations, patient history, and epidemiological information. The expected result is Negative.  Fact Sheet for Patients:  https://www.moore.com/https://www.fda.gov/media/142436/download  Fact Sheet for Healthcare Providers:  https://www.young.biz/https://www.fda.gov/media/142435/download  This test is no t yet approved or cleared by the [place] FDA and  has been authorized for detection and/or diagnosis of SARS-CoV-2 by FDA under an Emergency Use Authorization (EUA). This EUA will remain  in effect (meaning this test can be used) for the duration of the COVID-19 declaration under Section 564(b)(1) of the Act, 21 U.S.C. section 360bbb-3(b)(1), unless the authorization is terminated or revoked sooner.     Influenza A by PCR NEGATIVE NEGATIVE Final   Influenza B by PCR NEGATIVE NEGATIVE  Final    Comment: (NOTE) The Xpert Xpress SARS-CoV-2/FLU/RSV assay is intended as an aid in  the diagnosis of influenza from Nasopharyngeal swab specimens and  should not be used as a sole basis for treatment. Nasal washings and  aspirates are unacceptable for Xpert Xpress SARS-CoV-2/FLU/RSV  testing.  Fact Sheet for Patients: https://www.moore.com/https://www.fda.gov/media/142436/download  Fact Sheet for Healthcare Providers: https://www.young.biz/https://www.fda.gov/media/142435/download  This test is not yet approved or cleared by the [place] FDA and  has been authorized for detection and/or diagnosis of SARS-CoV-2 by  FDA under an Emergency Use Authorization (EUA). This EUA will remain  in effect (meaning this test can be used) for the duration of the  Covid-19 declaration under Section 564(b)(1) of the Act, 21  U.S.C. section 360bbb-3(b)(1), unless the authorization is  terminated or revoked.    Respiratory Syncytial Virus by PCR NEGATIVE NEGATIVE Final    Comment: (NOTE) Fact Sheet for Patients: https://www.moore.com/https://www.fda.gov/media/142436/download  Fact Sheet for Healthcare Providers: https://www.young.biz/https://www.fda.gov/media/142435/download  This test is not yet approved or cleared by the [place] FDA and  has been authorized for detection and/or diagnosis of SARS-CoV-2 by  FDA under an Emergency Use Authorization (EUA). This EUA will remain  in effect (meaning this test can be used) for the duration of the  COVID-19 declaration under Section 564(b)(1) of the Act, 21 U.S.C.  section 360bbb-3(b)(1), unless the authorization is terminated or  revoked. Performed at [facility], [address] [place], [place] [id number]     RADIOLOGY:  CT Head Wo Contrast  Result Date: 04/01/2020 CLINICAL DATA:  Headache EXAM: CT HEAD WITHOUT CONTRAST TECHNIQUE: Contiguous axial images were obtained from the base of the skull through the vertex without intravenous contrast. COMPARISON:  None. FINDINGS: Brain: No evidence of acute  infarction, hemorrhage, hydrocephalus, extra-axial collection or mass lesion/mass effect. Vascular: Negative for hyperdense vessel Skull: Negative Sinuses/Orbits: Paranasal sinuses clear.  Negative orbit. Other: None IMPRESSION: Negative CT head Electronically Signed   By: Marlan Palauharles  Clark M.D.   On: 04/01/2020 14:07   MR BRAIN WO CONTRAST  Result Date: 04/01/2020 CLINICAL DATA:  29 year old female with headache, migraine since yesterday. Pain radiates from the back to the front with photophobia, phonophobia. Low back pain, urinary retention. EXAM: MRI HEAD WITHOUT CONTRAST TECHNIQUE: Multiplanar, multiecho pulse sequences of the brain and surrounding structures were obtained without intravenous contrast. COMPARISON:  Head CT earlier today. FINDINGS: Brain: Normal cerebral volume. No restricted diffusion to suggest acute infarction. No midline shift, mass effect, evidence of mass lesion, ventriculomegaly, extra-axial collection or acute intracranial hemorrhage. Cervicomedullary junction and pituitary are within normal limits. Mild susceptibility artifact from earrings. Allowing for this, gray and white  matter signal throughout the brain is within normal limits. Vascular: Major intracranial vascular flow voids are preserved. Skull and upper cervical spine: Negative visible cervical spine. Normal bone marrow signal. Sinuses/Orbits: Negative. Other: Mastoids are clear. Visible internal auditory structures appear normal. Scalp and face soft tissues appear negative. IMPRESSION: Normal MRI appearance of the brain. Electronically Signed   By: Odessa Fleming M.D.   On: 04/01/2020 17:39   MR LUMBAR SPINE WO CONTRAST  Result Date: 04/01/2020 CLINICAL DATA:  29 year old female with headache, migraine since yesterday. Pain radiates from the back to the front with photophobia, phonophobia. Low back pain, urinary retention. EXAM: MRI LUMBAR SPINE WITHOUT CONTRAST TECHNIQUE: Multiplanar, multisequence MR imaging of the lumbar  spine was performed. No intravenous contrast was administered. COMPARISON:  None. FINDINGS: Segmentation: Lumbar segmentation appears to be normal and will be designated as such for this report. Alignment: Subtle levoconvex lumbar scoliosis. Mild straightening of lordosis. No spondylolisthesis. Vertebrae: No marrow edema or evidence of acute osseous abnormality. Visualized bone marrow signal is within normal limits. Intact visible sacrum and SI joints. Conus medullaris and cauda equina: Conus extends to the T12-L1 level. No lower spinal cord or conus signal abnormality. Capacious spinal canal. Normal cauda equina. Paraspinal and other soft tissues: Negative. Disc levels: T11-T12: Mild facet hypertrophy on the left.  Otherwise negative. T12-L1:  Negative. L1-L2:  Negative. L2-L3:  Negative. L3-L4:  Negative. L4-L5: Borderline to mild facet and ligament flavum hypertrophy. Otherwise negative. L5-S1:  Negative. IMPRESSION: Essentially normal MRI appearance of the lumbar spine. No spinal stenosis or neural impingement. Electronically Signed   By: Odessa Fleming M.D.   On: 04/01/2020 17:42    Management plans discussed with the patient, and she is in agreement.  CODE STATUS:     Code Status Orders  (From admission, onward)         Start     Ordered   04/01/20 1953  Full code  Continuous        04/01/20 1957        Code Status History    This patient has a current code status but no historical code status.   Advance Care Planning Activity      TOTAL TIME TAKING CARE OF THIS PATIENT: 45 minutes, in coordination of care speaking with nursing staff and urology.    Alford Highland M.D on 04/02/2020 at 3:54 PM  Between 7am to 6pm - Pager - [phone]  After 6pm go to [email or web address] - password [email or web address]  [facility]  CC: Primary care physician; Patient, No Pcp Per

## 2020-04-03 ENCOUNTER — Other Ambulatory Visit: Payer: Self-pay | Admitting: Internal Medicine

## 2020-04-03 ENCOUNTER — Ambulatory Visit
Admission: RE | Admit: 2020-04-03 | Discharge: 2020-04-03 | Disposition: A | Discharge status: Home / Self Care | Payer: Self-pay | Source: Ambulatory Visit | Attending: Internal Medicine | Admitting: Internal Medicine

## 2020-04-03 DIAGNOSIS — G8929 Other chronic pain: Secondary | ICD-10-CM

## 2020-04-03 DIAGNOSIS — R519 Headache, unspecified: Secondary | ICD-10-CM

## 2020-04-04 ENCOUNTER — Telehealth: Payer: Self-pay | Admitting: Urology

## 2020-04-04 NOTE — Telephone Encounter (Signed)
Lm for patient to cb to schedule a V & T per Dr. Annabell Howells she was seen in the ER over the weekend

## 2020-04-04 NOTE — Telephone Encounter (Signed)
-----   Message from Bjorn Pippin, MD sent at 04/02/2020  8:53 AM EDT ----- This patient was found to have urinary retention on an admission for a migraine.   She is going to need outpatient f/u in the next week for a voiding trial and will eventually need urodynamics.

## 2020-04-11 ENCOUNTER — Encounter: Payer: Self-pay | Admitting: Urology

## 2020-04-11 ENCOUNTER — Other Ambulatory Visit: Payer: Self-pay

## 2020-04-11 ENCOUNTER — Ambulatory Visit (INDEPENDENT_AMBULATORY_CARE_PROVIDER_SITE_OTHER): Payer: Self-pay | Admitting: Urology

## 2020-04-11 ENCOUNTER — Ambulatory Visit: Payer: Self-pay | Admitting: Urology

## 2020-04-11 VITALS — BP 110/76 | HR 92 | Ht 66.0 in | Wt 159.0 lb

## 2020-04-11 DIAGNOSIS — R339 Retention of urine, unspecified: Secondary | ICD-10-CM

## 2020-04-11 LAB — BLADDER SCAN AMB NON-IMAGING: Scan Result: 48

## 2020-04-11 NOTE — Progress Notes (Signed)
04/11/2020 8:50 AM   Kimberly Hardy 02-29-1991 [id number]  Referring provider: No referring provider defined for this encounter.  No chief complaint on file.   HPI: I was consulted to assess the patient's urinary retention. Some of the details were difficult to ascertain. She describes a slow flow and hard to urinate associated with urgency. Apparently she was catheterized for 1 L. 1 week prior to this she went to the emergency room with pain in the rectum.  On one occasion she tried to stimulate the clitoris hoping it would help her urinate.  She can get shooting pain in the rectum that comes and goes. She also describes left buttock pain radiating down the leg. I do not think she has other leg symptoms but is difficult to sort out.  She is having stabbing pain mainly in the rectum but sometimes in the vagina worse when she sits with a catheter.  She was seen in another [facility] for this visit.  She has been having trouble with bowel movements over the last few weeks. She says she is not constipated but asked to have a shower or self evacuate in order to have a bowel movement.  She normally voids every 2-3 hours gets up once a night with a good flow  It looks like she was admitted to the hospital for 24 hours with a migraine and retention with 900 mL. Patient started on Flomax. She has anxiety issues.  It appears she had a neurologic work-up with a normal MRI and head CT scan and MRI of the spine and brain  Denies a history of bladder surgery kidney stones or urinary tract infections. No hysterectomy. No vaginal numbness   PMH: No past medical history on file.  Surgical History: No past surgical history on file.  Home Medications:  Allergies as of 04/11/2020      Reactions   Oxycodone-acetaminophen Nausea Only   Not a true allergy, Pt doesn't like the side effects      Medication List       Accurate as of April 11, 2020  8:50 AM. If you have any questions, ask  your nurse or doctor.        amphetamine-dextroamphetamine 20 MG tablet Commonly known as: ADDERALL Take 20 mg by mouth 2 (two) times daily.   citalopram 40 MG tablet Commonly known as: CELEXA Take 40 mg by mouth at bedtime.   hydrOXYzine 50 MG capsule Commonly known as: VISTARIL Take 50 mg by mouth 2 (two) times daily as needed for anxiety (panic attacks).   PRESCRIPTION MEDICATION Take by mouth 2 (two) times daily.   tamsulosin 0.4 MG Caps capsule Commonly known as: FLOMAX Take 1 capsule (0.4 mg total) by mouth daily.       Allergies:  Allergies  Allergen Reactions  . Oxycodone-Acetaminophen Nausea Only    Not a true allergy, Pt doesn't like the side effects    Family History: No family history on file.  Social History:  reports that she has never smoked. She has never used smokeless tobacco. No history on file for alcohol use and drug use.  ROS:                                        Physical Exam: LMP 03/29/2020   Constitutional:  Alert and oriented, No acute distress. HEENT: [place] AT, moist mucus membranes.  Trachea midline, no  masses. Cardiovascular: No clubbing, cyanosis, or edema. Respiratory: Normal respiratory effort, no increased work of breathing. GI: Abdomen is soft, nontender, nondistended, no abdominal masses GU: No CVA tenderness.  Well supported bladder neck.  I was not impressed that she had any significant levator tenderness.  She describes the pain mainly near the perineum. Skin: No rashes, bruises or suspicious lesions. Lymph: No cervical or inguinal adenopathy. Neurologic: Grossly intact, no focal deficits, moving all 4 extremities. Psychiatric: Normal mood and affect.  Laboratory Data: Lab Results  Component Value Date   WBC 8.6 04/01/2020   HGB 13.2 04/01/2020   HCT 38.4 04/01/2020   MCV 91.9 04/01/2020   PLT 265 04/01/2020    Lab Results  Component Value Date   CREATININE 0.83 04/01/2020    No results  found for: PSA  No results found for: TESTOSTERONE  No results found for: HGBA1C  Urinalysis    Component Value Date/Time   COLORURINE YELLOW (A) 04/01/2020 1319   APPEARANCEUR CLEAR (A) 04/01/2020 1319   LABSPEC 1.014 04/01/2020 1319   PHURINE 6.0 04/01/2020 1319   GLUCOSEU NEGATIVE 04/01/2020 1319   HGBUR NEGATIVE 04/01/2020 1319   BILIRUBINUR NEGATIVE 04/01/2020 1319   KETONESUR NEGATIVE 04/01/2020 1319   PROTEINUR NEGATIVE 04/01/2020 1319   NITRITE NEGATIVE 04/01/2020 1319   LEUKOCYTESUR NEGATIVE 04/01/2020 1319    Pertinent Imaging: Reviewed CT scan.  Chart reviewed.  Urine reviewed and sent for culture.   Assessment & Plan: Patient has a complex presentation.  The pathophysiology of retention discussed.  She tends to not concentrate well and is quite anxious and I would politely redirect her to our discussion.  I explained to her that if she has ongoing retention we would need to order urodynamics and cystoscopy.  She understands I have some concern that she may have an underlying bowel but possibly a neurologic issue but she has had a negative work-up in the hospital but I do not think she is seeing a neurologist.  I do not think she has interstitial cystitis.  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  She understands the concept of a clean remittent catheterization if needed at least for short-term management if she does not void well today.  She is on Flomax but did not take it today.  Her primary care is in [place].  See nurse practitioner today for follow-up trial of voiding  Her pelvic pain certainly could be the source of her inability to relax her pelvic floor or secondarily because bladder a contractility.  The cause of the pelvic pain may also secondarily be disrupting bladder function.  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  Physical therapy may be very helpful.  Urodynamics in the acute situation will be less  helpful.  Patient's voiding and her residuals 40 mL.  Reassess in 6 weeks with a residual  There are no diagnoses linked to this encounter.  No follow-ups on file.  Martina Sinner, MD  [facility] [address], [address] [place], [place] [address] [phone]

## 2020-05-23 ENCOUNTER — Ambulatory Visit: Payer: Self-pay | Admitting: Urology

## 2020-05-24 ENCOUNTER — Encounter: Payer: Self-pay | Admitting: Urology

## 2021-03-14 IMAGING — CT CT HEAD W/O CM
3 series · 16 of 46 positions shown, 19 images · non-contrast
Comparison: None.

CLINICAL DATA: Headache

EXAM:
CT HEAD WITHOUT CONTRAST
TECHNIQUE: Contiguous axial images were obtained from the base of the skull
through the vertex without intravenous contrast.

[Series 2: head wo · axial · 0.43mm/px · z∈[+623,+743]mm · 10 of 29 slices shown, 13 images]
[im 3/29  brain]
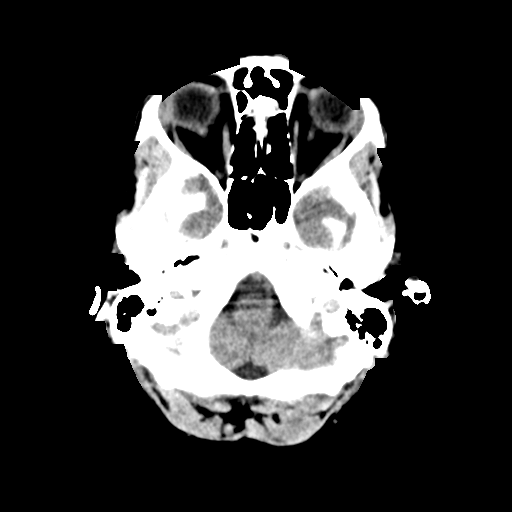
[im 3/29  bone]
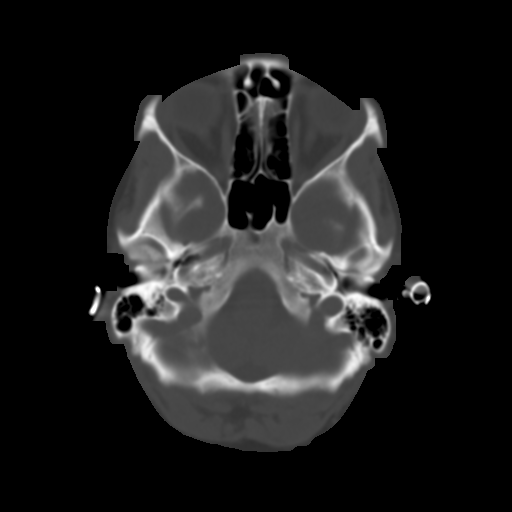
[im 6/29  brain]
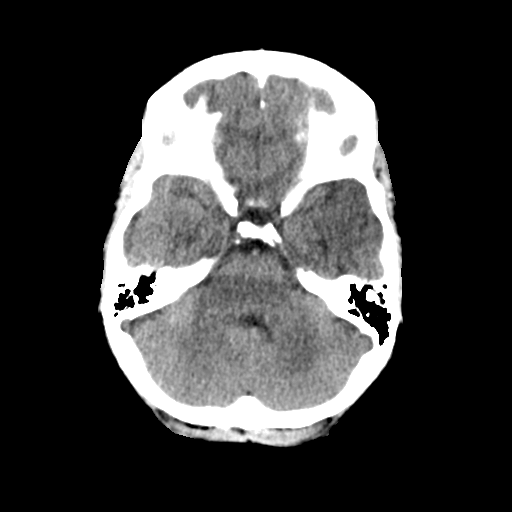
[im 8/29  brain]
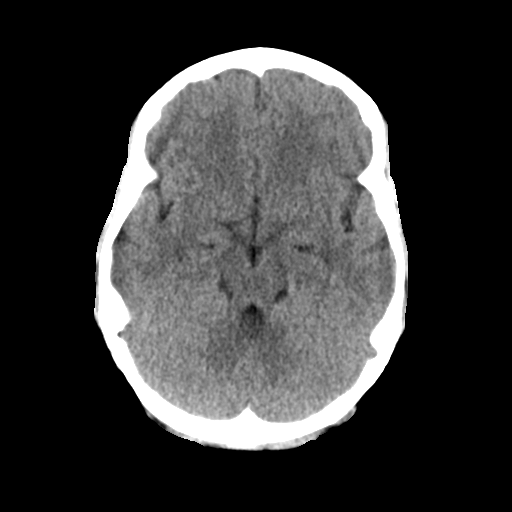
[im 11/29  brain]
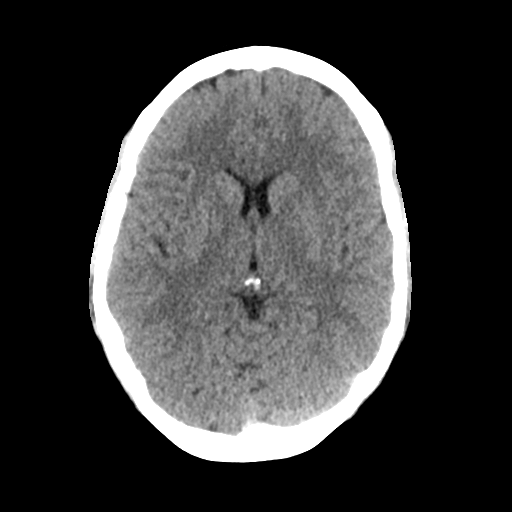
[im 14/29  brain]
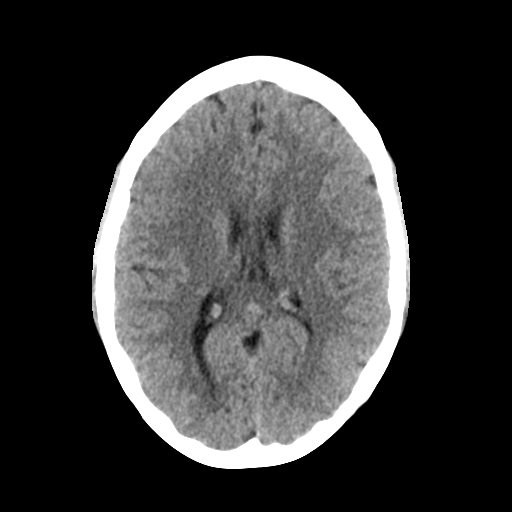
[im 14/29  bone]
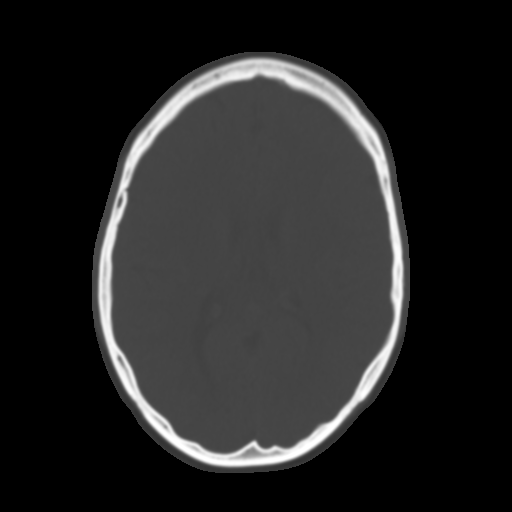
[im 16/29  brain]
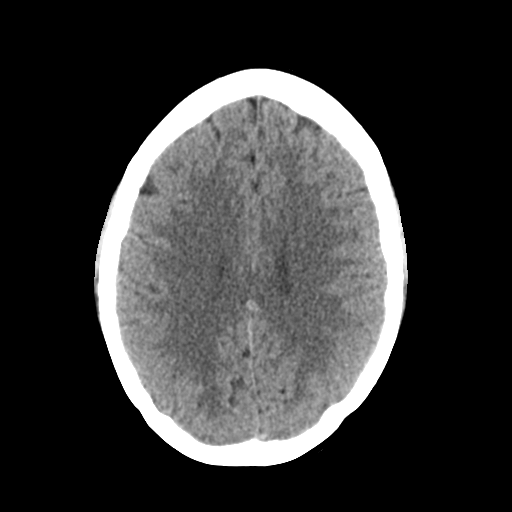
[im 19/29  brain]
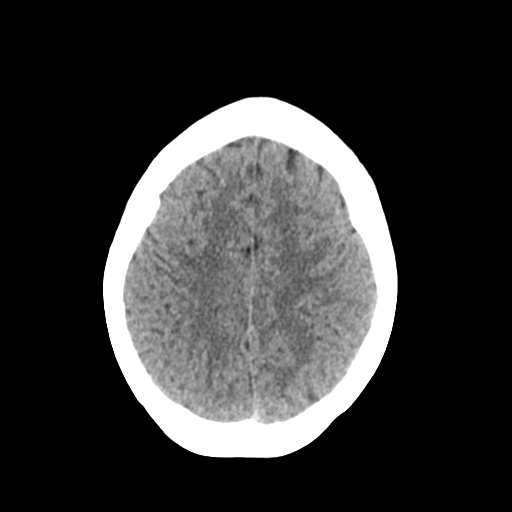
[im 22/29  brain]
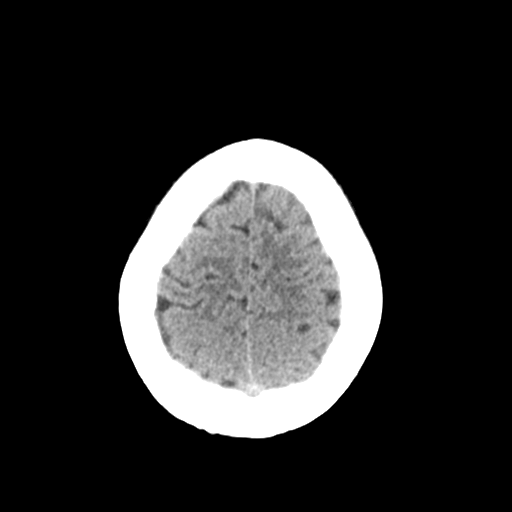
[im 24/29  brain]
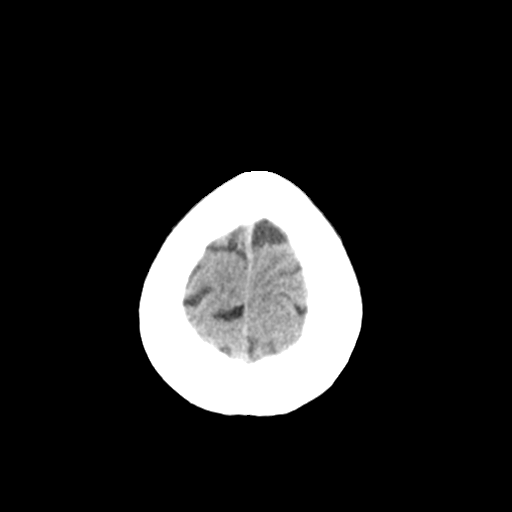
[im 24/29  bone]
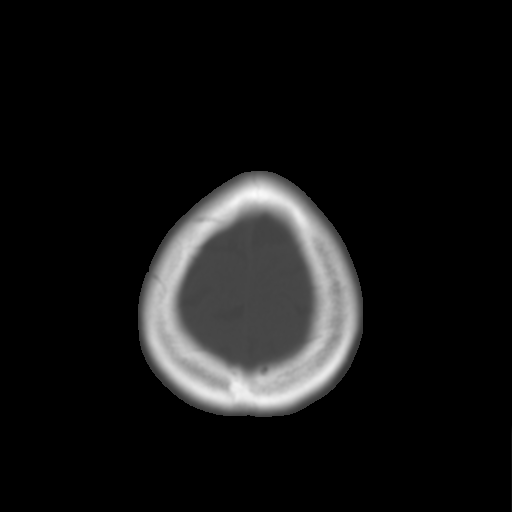
[im 27/29  brain]
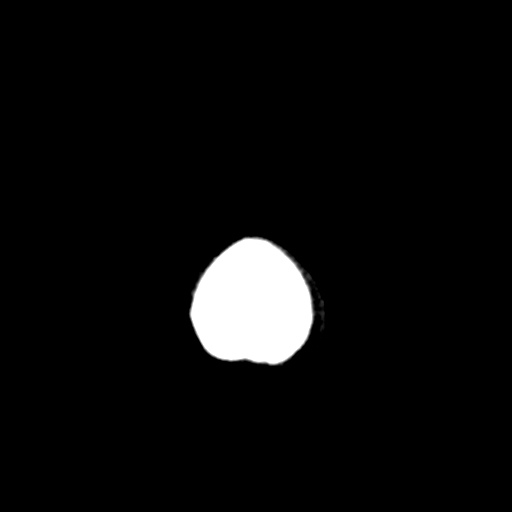

[Series 4: coronal soft tissue · coronal · 0.29mm/px · 3 of 64 slices shown]
[im 22/64  brain]
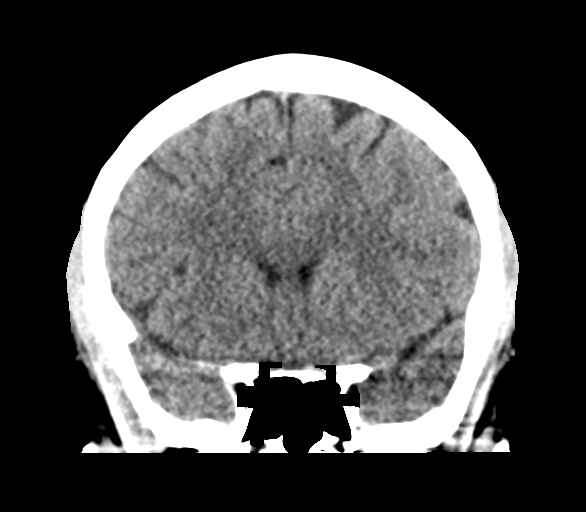
[im 29/64  brain]
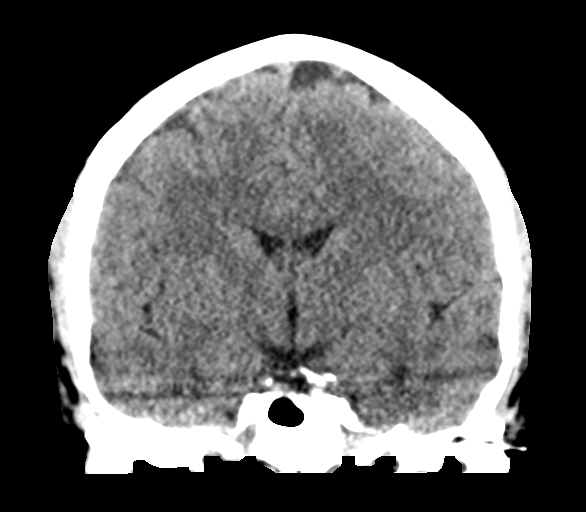
[im 36/64  brain]
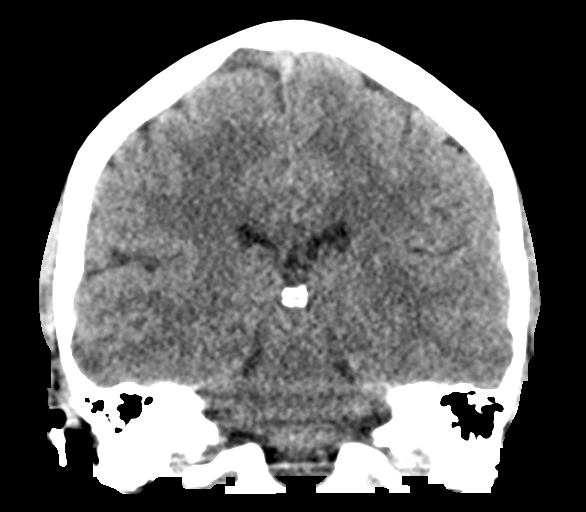

[Series 5: sagittal soft tissue · sagittal · 0.31mm/px · 3 of 56 slices shown]
[im 19/56  brain]
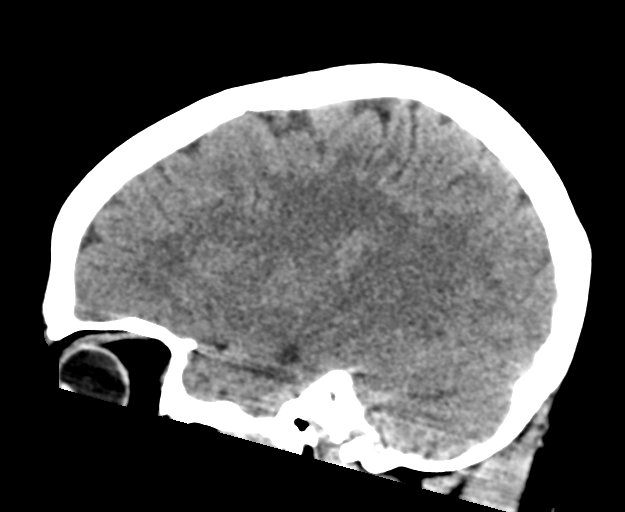
[im 28/56  brain]
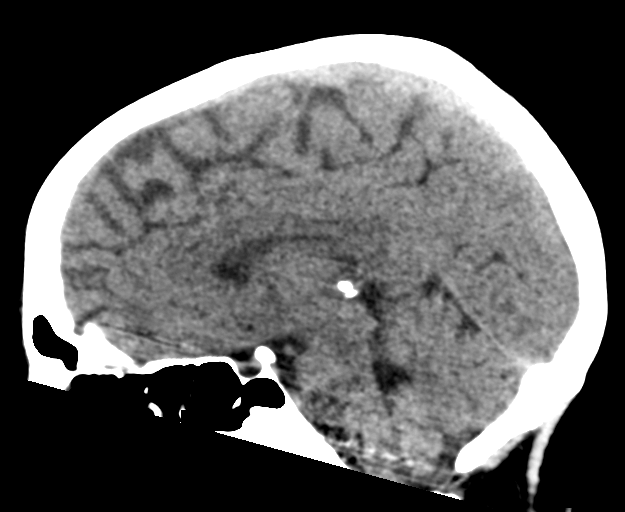
[im 37/56  brain]
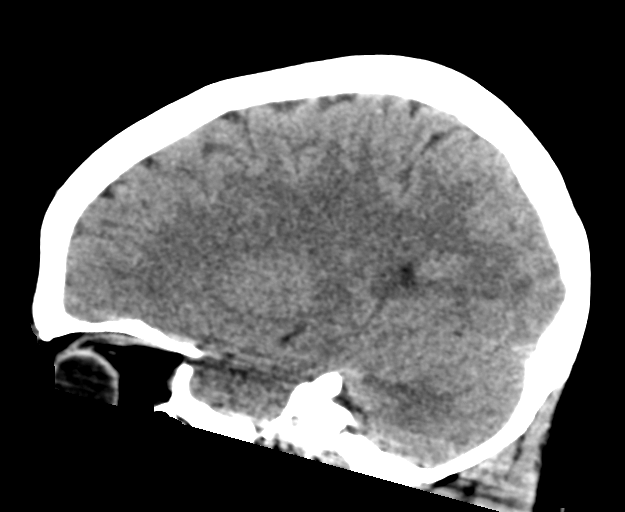

[16 of 46 positions shown; findings below may reference images not displayed]

FINDINGS: Brain: No evidence of acute infarction, hemorrhage, hydrocephalus,
extra-axial collection or mass lesion/mass effect.

Vascular: Negative for hyperdense vessel

Skull: Negative

Sinuses/Orbits: Paranasal sinuses clear.  Negative orbit.

Other: None
IMPRESSION: Negative CT head

## 2021-09-05 ENCOUNTER — Telehealth: Payer: Self-pay

## 2021-09-06 ENCOUNTER — Encounter: Payer: Self-pay | Admitting: Nurse Practitioner

## 2021-09-06 ENCOUNTER — Other Ambulatory Visit: Payer: Self-pay

## 2021-09-06 ENCOUNTER — Ambulatory Visit: Payer: Medicaid Other | Admitting: Nurse Practitioner

## 2021-09-06 VITALS — BP 103/67 | HR 78 | Temp 96.8°F | Wt 162.6 lb

## 2021-09-06 DIAGNOSIS — Z3401 Encounter for supervision of normal first pregnancy, first trimester: Secondary | ICD-10-CM | POA: Diagnosis not present

## 2021-09-06 DIAGNOSIS — Z720 Tobacco use: Secondary | ICD-10-CM | POA: Insufficient documentation

## 2021-09-06 DIAGNOSIS — F419 Anxiety disorder, unspecified: Secondary | ICD-10-CM

## 2021-09-06 DIAGNOSIS — Z3400 Encounter for supervision of normal first pregnancy, unspecified trimester: Secondary | ICD-10-CM

## 2021-09-06 LAB — URINALYSIS
Bilirubin, UA: NEGATIVE
Glucose, UA: NEGATIVE
Ketones, UA: NEGATIVE
Leukocytes,UA: NEGATIVE
Nitrite, UA: NEGATIVE
Protein,UA: NEGATIVE
RBC, UA: NEGATIVE
Specific Gravity, UA: 1.03 (ref 1.005–1.030)
Urobilinogen, Ur: 0.2 mg/dL (ref 0.2–1.0)
pH, UA: 5.5 (ref 5.0–7.5)

## 2021-09-06 LAB — HEMOGLOBIN, FINGERSTICK: Hemoglobin: 12.5 g/dL (ref 11.1–15.9)

## 2021-09-06 LAB — WET PREP FOR TRICH, YEAST, CLUE
Trichomonas Exam: NEGATIVE
Yeast Exam: NEGATIVE

## 2021-09-06 MED ORDER — CLOTRIMAZOLE 1 % VA CREA: 1.0000 | TOPICAL_CREAM | Freq: Every day | VAGINAL | 0 refills | Status: AC

## 2021-09-06 NOTE — Progress Notes (Signed)
Wet mount reviewed, patient given Clotrimazole per VO from [facility]. Hgb and urine dip reviewed with provider. [facility] contact card given. Patient counseled to expect call from [facility] to schedule genetic testing. Provider will let patient know about U/S if needed (after review of [facility] U/S results). Jenetta Downer, RN  ?

## 2021-09-06 NOTE — Progress Notes (Signed)
[facility] Department  ?Maternal Health Clinic ? ? ?INITIAL PRENATAL VISIT NOTE ? ?Subjective:  ?Kimberly Hardy is a 31 y.o. G1P0000 at Unknown being seen today to start prenatal care at the [facility] Department.  She is currently monitored for the following issues for this low-risk pregnancy and has Intractable headache; Acute urinary retention; Psychological stress; and Status migrainosus on their problem list. ? ?Patient reports nausea.  Contractions: Not present. Vag. Bleeding: None.  Movement: Absent. Denies leaking of fluid.  ? ?Indications for ASA therapy (per uptodate) ?One of the following: ?Previous pregnancy with preeclampsia, especially early onset and with an adverse outcome No ?Multifetal gestation No ?Chronic hypertension No ?Type 1 or 2 diabetes mellitus No ?Chronic kidney disease No ?Autoimmune disease (antiphospholipid syndrome, systemic lupus erythematosus) No ? ?Two or more of the following: ?Nulliparity Yes ?Obesity (body mass index >30 kg/m2) No ?Family history of preeclampsia in mother or sister No ?Age ?35 years No ?Sociodemographic characteristics (African American race, low socioeconomic level) No ?Personal risk factors (eg, previous pregnancy with low birth weight or small for gestational age infant, previous adverse pregnancy outcome [eg, stillbirth], interval >10 years between pregnancies) No ? ? ?The following portions of the patient's history were reviewed and updated as appropriate: allergies, current medications, past family history, past medical history, past social history, past surgical history and problem list. Problem list updated. ? ?Objective:  ? ?Vitals:  ? 09/06/21 0839  ?BP: 103/67  ?Pulse: 78  ?Temp: (!) 96.8 ?F (36 ?C)  ?Weight: 162 lb 9.6 oz (73.8 kg)  ? ? ?Fetal Status: Fetal Heart Rate (bpm): 140 Fundal Height: 11 cm Movement: Absent  Presentation: Undeterminable ? ? ?Physical Exam ?Vitals and nursing note reviewed.  ?Constitutional:   ?   General:  She is not in acute distress. ?   Appearance: Normal appearance. She is well-developed.  ?HENT:  ?   Head: Normocephalic and atraumatic.  ?   Right Ear: External ear normal.  ?   Left Ear: External ear normal.  ?   Nose: Nose normal. No congestion or rhinorrhea.  ?   Mouth/Throat:  ?   Lips: Pink.  ?   Mouth: Mucous membranes are moist.  ?   Dentition: Normal dentition. No dental caries.  ?   Pharynx: Oropharynx is clear. Uvula midline.  ?   Comments: No visible signs of dental caries.  ?Eyes:  ?   General: No scleral icterus. ?   Conjunctiva/sclera: Conjunctivae normal.  ?   Pupils: Pupils are equal, round, and reactive to light.  ?Neck:  ?   Thyroid: No thyroid mass or thyromegaly.  ?Cardiovascular:  ?   Rate and Rhythm: Normal rate and regular rhythm.  ?   Pulses: Normal pulses.  ?   Comments: Extremities are warm and well perfused ?Pulmonary:  ?   Effort: Pulmonary effort is normal.  ?   Breath sounds: Normal breath sounds.  ?Chest:  ?   Chest wall: No mass.  ?Breasts: ?   Tanner Score is 5.  ?   Breasts are symmetrical.  ?   Right: Normal. No mass, nipple discharge or skin change.  ?   Left: Normal. No mass, nipple discharge or skin change.  ?   Comments: Breasts:  ?      Right: Normal. No swelling, mass, nipple discharge, skin change or tenderness.  ?      Left: Normal. No swelling, mass, nipple discharge, skin change or tenderness.   ?Abdominal:  ?  General: Abdomen is flat. Bowel sounds are normal.  ?   Palpations: Abdomen is soft.  ?   Tenderness: There is no abdominal tenderness.  ?   Comments: Gravid   ?Genitourinary: ?   General: Normal vulva.  ?   Exam position: Lithotomy position.  ?   Pubic Area: No rash.   ?   Labia:     ?   Right: No rash.     ?   Left: No rash.   ?   Vagina: Normal. No vaginal discharge.  ?   Cervix: No cervical motion tenderness or friability.  ?   Uterus: Normal. Enlarged (Gravid 11 size). Not tender.   ?   Adnexa: Right adnexa normal and left adnexa normal.  ?   Rectum: Normal.  No external hemorrhoid.  ?   Comments: External genitalia/pubic area without nits, lice, edema, erythema, lesions and inguinal adenopathy. ?Vagina with normal mucosa and discharge. ?Cervix without visible lesions. ?Uterus firm, mobile, nt, no masses, no CMT, no adnexal tenderness or fullness. pH 4.5. ?Musculoskeletal:  ?   Cervical back: Full passive range of motion without pain, normal range of motion and neck supple.  ?   Right lower leg: No edema.  ?   Left lower leg: No edema.  ?Lymphadenopathy:  ?   Cervical: No cervical adenopathy.  ?   Upper Body:  ?   Right upper body: No axillary adenopathy.  ?   Left upper body: No axillary adenopathy.  ?Skin: ?   General: Skin is warm and dry.  ?   Capillary Refill: Capillary refill takes less than 2 seconds.  ?Neurological:  ?   Mental Status: She is alert and oriented to person, place, and time.  ?Psychiatric:     ?   Attention and Perception: Attention normal.     ?   Mood and Affect: Mood normal.     ?   Speech: Speech normal.     ?   Behavior: Behavior is cooperative.  ? ? ?Assessment and Plan:  ?Pregnancy: G1P0000 at Unknown ? ?1. Supervision of normal first pregnancy, antepartum ?-31 year old female in clinic today for initial prenatal visit.  ?-Patient states she is taking PNV daily. ?-Patient excited about the pregnancy.   ?-Lives alone, just recently lost her job and house soon to be sold or in foreclosure.  FOB not excited about the pregnancy.  ?- Complaints of nausea, vomiting, and headaches. Advised to eat small frequent meals high in protein every two hours.  ?-Patient is unsure of last LMP.  [facility]  Will review and assess proper EDD or send for an initial U/S through [facility].  ?-Last PAP 11/2020, NIL  ?-Patient states she currently vapes daily. Patient exposed to second hand smoke from family members.  ?-MJ and alcohol use occasionally, last use 07/19/21 ?-Last dental exam was several years ago.  Informed patient that  regular dental exams are appropriate and safe during pregnancy.  ?-Never received COVID or Flu vaccine. Declines all vaccines today.  ?-Patient agrees to genetic counseling.  Also desires to have NIPTs  ?-PHQ-9= 10, no thoughts of self harm.  Suffers from anxiety. ?-Wet mount reviewed, due to complaints of vaginal discomfort and irritation.  Ok to give patient Clotrimazole cream.   ? ? ?- HIV-1/HIV-2 Qualitative RNA ?- HCV Ab w Reflex to Quant PCR ?- Prenatal Profile with Varicella([id number]) ?- Urine Culture ?- Chlamydia/GC NAA, Confirmation ?- Urinalysis (Urine Dip) ?-  Hemoglobin, venipuncture ?- WET PREP FOR TRICH, YEAST, CLUE ?- [id number] Drug Screen ?- clotrimazole (GYNE-LOTRIMIN) 1 % vaginal cream; Place 1 Applicatorful vaginally at bedtime for 7 days.  Dispense: 45 g; Refill: 0  ? ?2. Anxiety ?-Patient has a history of anxiety.  Patient reports taking Vistaril to manage anxiety.  PHQ-9-10.  Denies thoughts of self harm.  Referred to counseling services to manage anxiety.  ?- Ambulatory Referral to Behavioral Health ? ?3. Vapes nicotine containing substance ?-Patient states she currently vapes daily.  She is trying to find dispensers without nicotine.  Reviewed the cause and effect nicotine has on the fetus.  ? ?Discussed overview of care and coordination with inpatient delivery [facility].  ? ? ? ?Term labor symptoms and general obstetric precautions including but not limited to vaginal bleeding, contractions, leaking of fluid and fetal movement were reviewed in detail with the patient. ? ?Please refer to After Visit Summary for other counseling recommendations.  ? ? ? ? ? ?Return in about 4 weeks (around 10/04/2021). ? ?Future Appointments  ?Date Time Provider Department Center  ?10/04/2021 10:00 AM [facility] PROVIDER [facility] None  ? ? ?Glenna FellowsAyo Tavaris Eudy, FNP ?

## 2021-09-06 NOTE — Progress Notes (Signed)
Here today for 11.2 week MH IP appt. Taking PNV QD. [facility]. Has spoken with R. Karrie Doffing regarding multiple social needs. Hal Morales, RN ? ?

## 2021-09-07 DIAGNOSIS — O26899 Other specified pregnancy related conditions, unspecified trimester: Secondary | ICD-10-CM | POA: Insufficient documentation

## 2021-09-07 LAB — CBC/D/PLT+RPR+RH+ABO+RUB AB...
Antibody Screen: NEGATIVE
Basophils Absolute: 0 10*3/uL (ref 0.0–0.2)
Basos: 0 %
EOS (ABSOLUTE): 0.1 10*3/uL (ref 0.0–0.4)
Eos: 1 %
Hematocrit: 37.7 % (ref 34.0–46.6)
Hemoglobin: 12.8 g/dL (ref 11.1–15.9)
Hepatitis B Surface Ag: NEGATIVE
Immature Grans (Abs): 0.1 10*3/uL (ref 0.0–0.1)
Immature Granulocytes: 1 %
Lymphocytes Absolute: 1.9 10*3/uL (ref 0.7–3.1)
Lymphs: 20 %
MCH: 31.6 pg (ref 26.6–33.0)
MCHC: 34 g/dL (ref 31.5–35.7)
MCV: 93 fL (ref 79–97)
Monocytes Absolute: 0.9 10*3/uL (ref 0.1–0.9)
Monocytes: 10 %
Neutrophils Absolute: 6.5 10*3/uL (ref 1.4–7.0)
Neutrophils: 68 %
Platelets: 268 10*3/uL (ref 150–450)
RBC: 4.05 x10E6/uL (ref 3.77–5.28)
RDW: 11.8 % (ref 11.7–15.4)
RPR Ser Ql: NONREACTIVE
Rh Factor: NEGATIVE
Rubella Antibodies, IGG: 2.5 index (ref >0.99)
Varicella zoster IgG: 634 index (ref >165)
WBC: 9.4 10*3/uL (ref 3.4–10.8)

## 2021-09-07 LAB — HIV-1/HIV-2 QUALITATIVE RNA
HIV-1 RNA, Qualitative: NONREACTIVE
HIV-2 RNA, Qualitative: NONREACTIVE

## 2021-09-07 LAB — HCV INTERPRETATION

## 2021-09-07 LAB — HCV AB W REFLEX TO QUANT PCR: HCV Ab: NONREACTIVE

## 2021-09-07 NOTE — Progress Notes (Signed)
[facility] U/S and genetic testing referral faxed with confirmation.Burt Knack, RN  ?

## 2021-09-08 LAB — CHLAMYDIA/GC NAA, CONFIRMATION
Chlamydia trachomatis, NAA: NEGATIVE
Neisseria gonorrhoeae, NAA: NEGATIVE

## 2021-09-09 LAB — 789231 7+OXYCODONE-BUND
Amphetamines, Urine: NEGATIVE ng/mL
BENZODIAZ UR QL: NEGATIVE ng/mL
Barbiturate screen, urine: NEGATIVE ng/mL
Cannabinoid Quant, Ur: NEGATIVE ng/mL
Cocaine (Metab.): NEGATIVE ng/mL
OPIATE SCREEN URINE: NEGATIVE ng/mL
Oxycodone/Oxymorphone, Urine: NEGATIVE ng/mL
PCP Quant, Ur: NEGATIVE ng/mL

## 2021-09-09 LAB — URINE CULTURE

## 2021-09-15 ENCOUNTER — Telehealth: Payer: Self-pay

## 2021-09-15 NOTE — Telephone Encounter (Signed)
On 09/07/2021, [facility] [place] referral faxed with confirmation received. As no appt yet scheduled, call to [facility] to ascertain receipt of referral. Per [facility]ine[facility]let OB US scheduler, no eferral received. Referral with snapshot pages faxed and confirmation received. Rich Number, RN ? ?

## 2021-09-27 NOTE — Addendum Note (Signed)
Addended by: Johnesha Acheampong on: 09/27/2021 10:35 AM ? ? Modules accepted: Orders ? ?

## 2021-10-04 ENCOUNTER — Ambulatory Visit: Payer: Medicaid Other

## 2021-12-11 DIAGNOSIS — O99332 Smoking (tobacco) complicating pregnancy, second trimester: Secondary | ICD-10-CM | POA: Insufficient documentation

## 2021-12-11 DIAGNOSIS — F1291 Cannabis use, unspecified, in remission: Secondary | ICD-10-CM | POA: Insufficient documentation

## 2022-01-01 DIAGNOSIS — O99013 Anemia complicating pregnancy, third trimester: Secondary | ICD-10-CM | POA: Insufficient documentation

## 2022-03-25 DIAGNOSIS — Z22330 Carrier of Group B streptococcus: Secondary | ICD-10-CM | POA: Insufficient documentation

## 2023-04-11 ENCOUNTER — Other Ambulatory Visit: Payer: Self-pay

## 2023-04-12 ENCOUNTER — Other Ambulatory Visit: Payer: Self-pay

## 2023-04-14 NOTE — Progress Notes (Deleted)
Kimberly Amy, PA-C [address]  [address]  [place], [place] [address]  Main: [phone]  Fax: [phone]   Gastroenterology Consultation  Referring Provider:     Caryl Hardy Primary Care Physician:  Patient, No Pcp Per Primary Gastroenterologist:  *** Reason for Consultation:     GERD        HPI:   Kimberly Hardy is a 32 y.o. y/o female referred for consultation & management  by Patient, No Pcp Per.    Patient has history of chronic GERD and irritable bowel syndrome with constipation.  History of chronic abdominal and pelvic pain.  Previously saw gastroenterologist through [facility] in 2017.  Last abdominal pelvic CT 12/2018 showed no acute abnormality.  EGD 01/2016, Dr. Sheppard Hardy at [facility]:  Report not available.  EGD 10/2015 Dr. Pamala Hardy at [facility]: Gastritis, LA grade A esophagitis.  Colonoscopy 10/2015 at [facility]: Polyps, hemorrhoids  Had Nexplanon implanted 07/2022 for contraception.  Followed by [facility] clinic OB[facility]GYN.  Past Medical History:  Diagnosis Date   Anxiety    Headache    Mental disorder    Panic Attacks    No past surgical history on file.  Prior to Admission medications   Medication Sig Start Date End Date Taking? Authorizing Provider  acetaminophen (TYLENOL) 325 MG tablet Take by mouth. 03/28/22   [provider]  amphetamine-dextroamphetamine (ADDERALL) 20 MG tablet Take 20 mg by mouth 2 (two) times daily. Patient not taking: Reported on 09/06/2021    [provider]  citalopram (CELEXA) 40 MG tablet Take 40 mg by mouth at bedtime. Patient not taking: Reported on 09/06/2021    [provider]  Ferrous Sulfate (IRON PO) Take 1 tablet by mouth daily.    [provider]  hydrOXYzine (VISTARIL) 50 MG capsule Take 50 mg by mouth 2 (two) times daily as needed for anxiety (panic attacks).    [provider]  Prenatal Vit-Fe Fumarate-FA (MULTIVITAMIN-PRENATAL) 27-0.8 MG TABS tablet Take 1 tablet by mouth daily  at 12 noon.    [provider]  PRESCRIPTION MEDICATION Take by mouth 2 (two) times daily.    [provider]  tamsulosin (FLOMAX) 0.4 MG CAPS capsule Take 1 capsule (0.4 mg total) by mouth daily. Patient not taking: Reported on 09/06/2021 04/03/20   Alford Highland, MD    Family History  Problem Relation Age of Onset   Diabetes Mother    Heart disease Father    Arthritis Maternal Grandmother    Heart disease Paternal Grandmother    Heart disease Paternal Grandfather      Social History   Tobacco Use   Smoking status: Hardy   Smokeless tobacco: Hardy   Tobacco comments:    Per patient is exposed daily to secondhand smoke.  Vaping Use   Vaping status: Every Day  Substance Use Topics   Alcohol use: Not Currently    Comment: Last use 06/2021   Drug use: Hardy    Allergies as of 04/15/2023 - Review Complete 09/06/2021  Allergen Reaction Noted   Oxycodone-acetaminophen Nausea Only 10/18/2015    Review of Systems:    All systems reviewed and negative except where noted in HPI.   Physical Exam:  There were no vitals taken for this visit. No LMP recorded.  General:   Alert,  Well-developed, well-nourished, pleasant and cooperative in NAD Lungs:  Respirations even and unlabored.  Clear throughout to auscultation.   No wheezes, crackles, or rhonchi. No acute distress. Heart:  Regular rate and rhythm; no murmurs, clicks, rubs, or gallops. Abdomen:  Normal bowel sounds.  No bruits.  Soft, and non-distended without masses, hepatosplenomegaly or hernias noted.  No Tenderness.  No guarding or rebound tenderness.    Neurologic:  Alert and oriented x3;  grossly normal neurologically. Psych:  Alert and cooperative. Normal mood and affect.  Imaging Studies: No results found.  Assessment and Plan:   Kimberly Hardy is a 32 y.o. y/o female has been referred for   GERD   Recommend Lifestyle Modifications to prevent Acid Reflux.  Rec. Avoid coffee, sodas, peppermint,  citrus fruits, and spicey foods.  Avoid eating 2-3 hours before bedtime.   Follow up ***  Kimberly Amy, PA-C    BP check ***

## 2023-04-15 ENCOUNTER — Ambulatory Visit: Payer: Medicaid Other | Admitting: Physician Assistant

## 2023-04-15 ENCOUNTER — Encounter: Payer: Self-pay | Admitting: Physician Assistant

## 2023-04-15 ENCOUNTER — Ambulatory Visit (INDEPENDENT_AMBULATORY_CARE_PROVIDER_SITE_OTHER): Payer: Medicaid Other | Admitting: Physician Assistant

## 2023-04-15 VITALS — BP 108/71 | HR 74 | Temp 97.8°F | Ht 64.0 in | Wt 154.8 lb

## 2023-04-15 DIAGNOSIS — K219 Gastro-esophageal reflux disease without esophagitis: Secondary | ICD-10-CM | POA: Diagnosis not present

## 2023-04-15 DIAGNOSIS — K582 Mixed irritable bowel syndrome: Secondary | ICD-10-CM | POA: Diagnosis not present

## 2023-04-15 MED ORDER — DEXLANSOPRAZOLE 60 MG PO CPDR
60.0000 mg | DELAYED_RELEASE_CAPSULE | Freq: Every day | ORAL | 3 refills | Status: DC
Start: 2023-04-15 — End: 2024-01-29

## 2023-04-15 NOTE — Progress Notes (Signed)
Celso Amy, PA-C [address]  [address]  [place], [place] [address]  Main: [phone]  Fax: [phone]   Gastroenterology Consultation  Referring Provider:     Caryl Never Primary Care Physician:  Patient, No Pcp Per Primary Gastroenterologist:  Celso Amy, PA-C  Reason for Consultation:     GERD, IBS        HPI:   Kimberly Hardy is a 32 y.o. y/o female referred for consultation & management  by Patient, No Pcp Per.    Current symptoms: Patient states she is taking pantoprazole 40 mg twice daily which is not controlling her acid reflux.  She has heartburn, indigestion, and excessive belching.  It is worse when she is under stress.  She reports having history of acid reflux since her teenage years.  She has tried Prilosec, Prevacid, Nexium, and Protonix, all of which no longer controlled her acid reflux.  She also has irregular bowel habits with diarrhea alternating with constipation.  Denies rectal bleeding or weight loss.    Patient has history of chronic GERD and irritable bowel syndrome.  History of chronic abdominal and pelvic pain.  Previously saw gastroenterologist through [facility] in 2017.  Last abdominal pelvic CT 12/2018 showed no acute abnormality.  EGD 01/2016, Dr. Sheppard Penton at [facility]:  Report not available.  EGD 10/2015 Dr. Pamala Hurry at [facility]: Gastritis, LA grade A esophagitis.  Colonoscopy 10/2015 at [facility]: Polyps, hemorrhoids  Had Nexplanon implanted 07/2022 for contraception.  Followed by [facility] clinic OB/GYN.  Past Medical History:  Diagnosis Date   Anxiety    Headache    Mental disorder    Panic Attacks    No past surgical history on file.  Prior to Admission medications   Medication Sig Start Date End Date Taking? Authorizing Provider  acetaminophen (TYLENOL) 325 MG tablet Take by mouth. 03/28/22   [provider]  amphetamine-dextroamphetamine (ADDERALL) 20 MG tablet Take 20 mg by mouth 2 (two) times daily. Patient not taking:  Reported on 09/06/2021    [provider]  citalopram (CELEXA) 40 MG tablet Take 40 mg by mouth at bedtime. Patient not taking: Reported on 09/06/2021    [provider]  Ferrous Sulfate (IRON PO) Take 1 tablet by mouth daily.    [provider]  hydrOXYzine (VISTARIL) 50 MG capsule Take 50 mg by mouth 2 (two) times daily as needed for anxiety (panic attacks).    [provider]  Prenatal Vit-Fe Fumarate-FA (MULTIVITAMIN-PRENATAL) 27-0.8 MG TABS tablet Take 1 tablet by mouth daily at 12 noon.    [provider]  PRESCRIPTION MEDICATION Take by mouth 2 (two) times daily.    [provider]  tamsulosin (FLOMAX) 0.4 MG CAPS capsule Take 1 capsule (0.4 mg total) by mouth daily. Patient not taking: Reported on 09/06/2021 04/03/20   Alford Highland, MD    Family History  Problem Relation Age of Onset   Diabetes Mother    Heart disease Father    Arthritis Maternal Grandmother    Heart disease Paternal Grandmother    Heart disease Paternal Grandfather      Social History   Tobacco Use   Smoking status: Never   Smokeless tobacco: Never   Tobacco comments:    Per patient is exposed daily to secondhand smoke.  Vaping Use   Vaping status: Every Day  Substance Use Topics   Alcohol use: Not Currently    Comment: Last use 06/2021   Drug use: Never  Allergies as of 04/15/2023 - Review Complete 09/06/2021  Allergen Reaction Noted   Oxycodone-acetaminophen Nausea Only 10/18/2015    Review of Systems:    All systems reviewed and negative except where noted in HPI.   Physical Exam:  BP 108/71, pulse 74, temp 97.8, weight 154, height 5 feet 4 inches, BMI 26.57.  General:   Alert,  Well-developed, well-nourished, pleasant and cooperative in NAD Lungs:  Respirations even and unlabored.  Clear throughout to auscultation.   No wheezes, crackles, or rhonchi. No acute distress. Heart:  Regular rate and rhythm; no murmurs, clicks, rubs, or  gallops. Abdomen:  Normal bowel sounds.  No bruits.  Soft, and non-distended without masses, hepatosplenomegaly or hernias noted.  No Tenderness.  No guarding or rebound tenderness.    Neurologic:  Alert and oriented x3;  grossly normal neurologically. Psych:  Alert and cooperative. Normal mood and affect.  Imaging Studies: No results found.  Assessment and Plan:   Ladestiny Tschetter is a 32 y.o. y/o female has been referred for:  GERD  She has tried Prilosec, Protonix, Nexium, and Prevacid which have not controlled her acid reflux. Stop pantoprazole. Start Rx Dexilant 60 mg 1 capsule once daily. Add OTC Pepcid 20 Mg twice daily and Tums as needed.  Recommend Lifestyle Modifications to prevent Acid Reflux.  Rec. Avoid coffee, sodas, peppermint, citrus fruits, and spicey foods.  Avoid eating 2-3 hours before bedtime.   Avoid OTC NSAIDs such as Advil, ibuprofen, Aleve, Goody powders, and BC powders.  Okay to take Tylenol or acetaminophen as needed.  2.  Irritable bowel syndrome-Mixed: Constipation and Diarrhea  Start Benefiber powder, mix 1 or 2 tablespoons in a drink once daily.  Start MiraLAX powder, mix 1 capful in a drink once daily.  Start OTC align probiotic 1 capsule once daily.  Drink 64 ounces of fluids daily.    Follow up in 2 months with TG.  Celso Amy, PA-C

## 2023-06-20 ENCOUNTER — Other Ambulatory Visit: Payer: Self-pay

## 2023-06-23 ENCOUNTER — Telehealth: Payer: Self-pay

## 2023-06-23 NOTE — Telephone Encounter (Signed)
 Per pt she is now taking the twice the dose on some day.Some days she  takes one pill in the morning and one in the afternoon due to the fact one pill isn't strong enough. Pt also wants to know if she needs to have another EGD or Colonoscopy of both? Pt had to move appt from 06-20-23 to 07-24-23 due to child care.

## 2023-06-24 ENCOUNTER — Telehealth: Payer: Self-pay

## 2023-06-24 ENCOUNTER — Other Ambulatory Visit: Payer: Self-pay

## 2023-06-24 ENCOUNTER — Ambulatory Visit: Payer: Medicaid Other | Admitting: Physician Assistant

## 2023-06-24 DIAGNOSIS — K582 Mixed irritable bowel syndrome: Secondary | ICD-10-CM

## 2023-06-24 DIAGNOSIS — K219 Gastro-esophageal reflux disease without esophagitis: Secondary | ICD-10-CM

## 2023-06-24 MED ORDER — PEG 3350-KCL-NA BICARB-NACL 420 G PO SOLR: 4000.0000 mL | Freq: Once | ORAL | 0 refills | Status: AC

## 2023-06-24 NOTE — Telephone Encounter (Signed)
 Patient stated she is taking Dexilant - she would like to schedule EGD/Colonoscopy 07-25-23 with Dr.Wohl- Instructions mailed and Golytely prep sent to pharmacy.   Please clarify with patient which medication she is taking twice daily?  At her last appointment I started her on Dexilant  60 mg once daily.   Okay to order EGD; diagnosis GERD with esophagitis not controlled on PPI.  Okay to order colonoscopy; diagnosis history of colon polyps; GoLytely prep.  Postpone follow-up until after procedures.

## 2023-07-22 ENCOUNTER — Other Ambulatory Visit: Payer: Self-pay

## 2023-07-23 ENCOUNTER — Telehealth: Payer: Self-pay

## 2023-07-23 NOTE — Telephone Encounter (Signed)
Patient I needing to reschedule her procedure that her schedule a EGD on 07/25/2023. She states she does not have no one to drive her and she does not have a babysitter. She states she needs to reschedule the procedure.

## 2023-07-23 NOTE — Telephone Encounter (Signed)
The patient called in to reschedule her procedure. The best days are Mondays or Fridays.

## 2023-07-23 NOTE — Progress Notes (Deleted)
    Ellouise Console, PA-C [address]  [address]  [place], [place] [address]  Main: [phone]  Fax: [phone]   Primary Care Physician: Patient, No Pcp Per  Primary Gastroenterologist:  ***  CC: f/u GERD and IBS  HPI: Kimberly Hardy is a 33 y.o. female returns for 38-month follow-up of GERD and IBS.  3 months ago she was started on Dexilant  60 mg once daily and Pepcid 20 Mg twice daily for GERD.  Started Benefiber, MiraLAX, and align probiotic for IBS.  Current symptoms  She is currently scheduled for EGD in March 2025.  She previously rescheduled procedure.  Patient has history of chronic GERD and irritable bowel syndrome.  History of chronic abdominal and pelvic pain.  Previously saw gastroenterologist through [facility] in 2017.  Last abdominal pelvic CT 12/2018 showed no acute abnormality.   EGD 01/2016, Dr. Gala at [facility]:  Report not available.   EGD 10/2015 Dr. Theodoro at [facility]: Gastritis, LA grade A esophagitis.   Colonoscopy 10/2015 at [facility]: Polyps, hemorrhoids  Current Outpatient Medications  Medication Sig Dispense Refill   acetaminophen (TYLENOL) 325 MG tablet Take by mouth.     clomiPRAMINE (ANAFRANIL) 50 MG capsule Take 100 mg by mouth at bedtime.     dexlansoprazole  (DEXILANT ) 60 MG capsule Take 1 capsule (60 mg total) by mouth daily. 90 capsule 3   sertraline (ZOLOFT) 100 MG tablet Take 100 mg by mouth daily.     SODIUM FLUORIDE 5000 SENSITIVE 1.1-5 % GEL PLEASE SEE ATTACHED FOR DETAILED DIRECTIONS     No current facility-administered medications for this visit.    Allergies as of 07/24/2023 - Review Complete 04/15/2023  Allergen Reaction Noted   Oxycodone-acetaminophen Nausea Only 10/18/2015    Past Medical History:  Diagnosis Date   Anxiety    Headache    Mental disorder    Panic Attacks    No past surgical history on file.  Review of Systems:    All systems reviewed and negative except where noted in HPI.   Physical Examination:    There were no vitals taken for this visit.  General: Well-nourished, well-developed in no acute distress.  Lungs: Clear to auscultation bilaterally. Non-labored. Heart: Regular rate and rhythm, no murmurs rubs or gallops.  Abdomen: Bowel sounds are normal; Abdomen is Soft; No hepatosplenomegaly, masses or hernias;  No Abdominal Tenderness; No guarding or rebound tenderness. Neuro: Alert and oriented x 3.  Grossly intact.  Psych: Alert and cooperative, normal mood and affect.   Imaging Studies: No results found.  Assessment and Plan:   Kimberly Hardy is a 33 y.o. y/o female ***    Ellouise Console, PA-C  Follow up ***  BP check ***

## 2023-07-24 ENCOUNTER — Ambulatory Visit: Payer: Medicaid Other | Admitting: Physician Assistant

## 2023-09-04 ENCOUNTER — Telehealth: Payer: Self-pay

## 2023-09-04 DIAGNOSIS — K582 Mixed irritable bowel syndrome: Secondary | ICD-10-CM

## 2023-09-04 DIAGNOSIS — K219 Gastro-esophageal reflux disease without esophagitis: Secondary | ICD-10-CM

## 2023-09-04 NOTE — Telephone Encounter (Signed)
 Patient called again and stated that she couldn't do her procedures on 09/09/2023 so she would like to do them on  09/23/2023 with Dr. Servando Snare. I then called the endo unit and spoke with Trish and she was able to switch the date.

## 2023-09-04 NOTE — Telephone Encounter (Signed)
 Patient called stating that she will need to reschedule her procedure from 09/05/2023 to 09/09/2023. I then called the endo unit to let them know and Chrystal stated that she would change it. Patient also requested to send her prescription to [facility] in [place] off of [address] Prescription was sent.

## 2023-09-05 NOTE — Telephone Encounter (Signed)
 Called patient back to let her know that she will be need to be changed to another date since the provider asked for that day off. Patient understood and she agreed on having her procedure done on 09/26/2023 with Dr. Allegra Lai. I then called Vickie from the endoscopy unit and let her know of the change.

## 2023-09-24 ENCOUNTER — Telehealth: Payer: Self-pay | Admitting: *Deleted

## 2023-09-24 NOTE — Telephone Encounter (Addendum)
 Patient called office to ask about her colonoscopy instructions because she had lost her instructions a long time ago. Suggested for patient to come by and pick up a print copy but patient stated it would be hard for her because she have a very young daughter.  Went over everything and answer as much as I could. Inform patient to call me back if she have more questions.

## 2023-09-26 ENCOUNTER — Encounter
Admission: RE | Disposition: A | Discharge status: Home / Self Care | Payer: Self-pay | Source: Ambulatory Visit | Attending: Gastroenterology

## 2023-09-26 ENCOUNTER — Ambulatory Visit: Admitting: Anesthesiology

## 2023-09-26 ENCOUNTER — Encounter: Payer: Self-pay | Admitting: Gastroenterology

## 2023-09-26 ENCOUNTER — Ambulatory Visit
Admission: RE | Admit: 2023-09-26 | Discharge: 2023-09-26 | Disposition: A | Discharge status: Home / Self Care | Payer: Medicaid Other | Source: Ambulatory Visit | Attending: Gastroenterology | Admitting: Gastroenterology

## 2023-09-26 DIAGNOSIS — K229 Disease of esophagus, unspecified: Secondary | ICD-10-CM | POA: Diagnosis not present

## 2023-09-26 DIAGNOSIS — F419 Anxiety disorder, unspecified: Secondary | ICD-10-CM | POA: Insufficient documentation

## 2023-09-26 DIAGNOSIS — R194 Change in bowel habit: Secondary | ICD-10-CM | POA: Diagnosis present

## 2023-09-26 DIAGNOSIS — Z79899 Other long term (current) drug therapy: Secondary | ICD-10-CM | POA: Diagnosis not present

## 2023-09-26 DIAGNOSIS — K529 Noninfective gastroenteritis and colitis, unspecified: Secondary | ICD-10-CM | POA: Insufficient documentation

## 2023-09-26 DIAGNOSIS — K219 Gastro-esophageal reflux disease without esophagitis: Secondary | ICD-10-CM | POA: Diagnosis present

## 2023-09-26 DIAGNOSIS — K2289 Other specified disease of esophagus: Secondary | ICD-10-CM | POA: Diagnosis not present

## 2023-09-26 DIAGNOSIS — K582 Mixed irritable bowel syndrome: Secondary | ICD-10-CM

## 2023-09-26 HISTORY — DX: Constipation, unspecified: K59.00

## 2023-09-26 HISTORY — PX: ESOPHAGOGASTRODUODENOSCOPY (EGD) WITH PROPOFOL: SHX5813

## 2023-09-26 HISTORY — PX: COLONOSCOPY WITH PROPOFOL: SHX5780

## 2023-09-26 LAB — POCT PREGNANCY, URINE: Preg Test, Ur: NEGATIVE

## 2023-09-26 SURGERY — ESOPHAGOGASTRODUODENOSCOPY (EGD) WITH PROPOFOL
Anesthesia: General

## 2023-09-26 MED ORDER — LIDOCAINE HCL (CARDIAC) PF 100 MG/5ML IV SOSY
PREFILLED_SYRINGE | INTRAVENOUS | Status: DC | PRN
  Administered 2023-09-26: 70 mg via INTRAVENOUS

## 2023-09-26 MED ORDER — EPHEDRINE 5 MG/ML INJ
INTRAVENOUS | Status: AC
  Filled 2023-09-26: qty 5

## 2023-09-26 MED ORDER — SODIUM CHLORIDE 0.9 % IV SOLN
INTRAVENOUS | Status: DC
Start: 2023-09-26 — End: 2023-09-26
  Administered 2023-09-26: 1000 mL via INTRAVENOUS

## 2023-09-26 MED ORDER — PROPOFOL 10 MG/ML IV BOLUS
INTRAVENOUS | Status: DC | PRN
  Administered 2023-09-26: 30 mg via INTRAVENOUS
  Administered 2023-09-26: 100 mg via INTRAVENOUS
  Administered 2023-09-26: 20 mg via INTRAVENOUS
  Administered 2023-09-26: 30 mg via INTRAVENOUS

## 2023-09-26 MED ORDER — PROPOFOL 500 MG/50ML IV EMUL
INTRAVENOUS | Status: DC | PRN
  Administered 2023-09-26: 150 ug/kg/min via INTRAVENOUS
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
## 2023-09-26 NOTE — Op Note (Signed)
 [facility] Gastroenterology Patient Name: Kimberly Hardy Procedure Date: 09/26/2023 10:27 AM MRN: [id number] Account #: [id number] Date of Birth: 1991/06/07 Admit Type: Outpatient Age: 33 Room: [facility] ENDO ROOM 3 Gender: Female Note Status: Finalized Instrument Name: Peds Colonoscope [id number] Procedure:             Colonoscopy Indications:           Change in bowel habits Providers:             Toney Reil MD, MD Referring MD:          No Local Md, MD (Referring MD) Medicines:             General Anesthesia Complications:         No immediate complications. Estimated blood loss: None. Procedure:             Pre-Anesthesia Assessment:                        - Prior to the procedure, a History and Physical was                         performed, and patient medications and allergies were                         reviewed. The patient is competent. The risks and                         benefits of the procedure and the sedation options and                         risks were discussed with the patient. All questions                         were answered and informed consent was obtained.                         Patient identification and proposed procedure were                         verified by the physician, the nurse, the                         anesthesiologist, the anesthetist and the technician                         in the pre-procedure area in the procedure room in the                         endoscopy suite. Mental Status Examination: alert and                         oriented. Airway Examination: normal oropharyngeal                         airway and neck mobility. Respiratory Examination:                         clear to auscultation. CV Examination: normal.  Prophylactic Antibiotics: The patient does not require                         prophylactic antibiotics. Prior Anticoagulants: The                         patient has taken  no anticoagulant or antiplatelet                         agents. ASA Grade Assessment: II - A patient with mild                         systemic disease. After reviewing the risks and                         benefits, the patient was deemed in satisfactory                         condition to undergo the procedure. The anesthesia                         plan was to use general anesthesia. Immediately prior                         to administration of medications, the patient was                         re-assessed for adequacy to receive sedatives. The                         heart rate, respiratory rate, oxygen saturations,                         blood pressure, adequacy of pulmonary ventilation, and                         response to care were monitored throughout the                         procedure. The physical status of the patient was                         re-assessed after the procedure.                        After obtaining informed consent, the colonoscope was                         passed under direct vision. Throughout the procedure,                         the patient's blood pressure, pulse, and oxygen                         saturations were monitored continuously. The                         Colonoscope was introduced through the anus and  advanced to the the terminal ileum, with                         identification of the appendiceal orifice and IC                         valve. The colonoscopy was performed without                         difficulty. The patient tolerated the procedure well.                         The quality of the bowel preparation was evaluated                         using the BBPS [facility] Bowel Preparation Scale) with                         scores of: Right Colon = 3, Transverse Colon = 3 and                         Left Colon = 3 (entire mucosa seen well with no                         residual staining, small  fragments of stool or opaque                         liquid). The total BBPS score equals 9. The terminal                         ileum, ileocecal valve, appendiceal orifice, and                         rectum were photographed. Findings:      The perianal and digital rectal examinations were normal. Pertinent       negatives include normal sphincter tone and no palpable rectal lesions.      The terminal ileum appeared normal.      The entire examined colon appeared normal.      The retroflexed view of the distal rectum and anal verge was normal and       showed no anal or rectal abnormalities. Impression:            - The examined portion of the ileum was normal.                        - The entire examined colon is normal.                        - The distal rectum and anal verge are normal on                         retroflexion view.                        - No specimens collected. Recommendation:        - Discharge patient to home (with escort).                        -  Resume previous diet today.                        - Continue present medications. Procedure Code(s):     --- Professional ---                        [phone], Colonoscopy, flexible; diagnostic, including                         collection of specimen(s) by brushing or washing, when                         performed (separate procedure) Diagnosis Code(s):     --- Professional ---                        R19.4, Change in bowel habit CPT copyright 2022 American Medical Association. All rights reserved. The codes documented in this report are preliminary and upon coder review may  be revised to meet current compliance requirements. Dr. Libby Maw Toney Reil MD, MD 09/26/2023 12:10:09 PM This report has been signed electronically. Number of Addenda: 0 Note Initiated On: 09/26/2023 10:27 AM Scope Withdrawal Time: 0 hours 4 minutes 48 seconds  Total Procedure Duration: 0 hours 7 minutes 19 seconds  Estimated  Blood Loss:  Estimated blood loss: none.      [facility]

## 2023-09-26 NOTE — Op Note (Signed)
 [facility] Gastroenterology Patient Name: Kimberly Hardy Procedure Date: 09/26/2023 10:28 AM MRN: [id number] Account #: [id number] Date of Birth: [birth year] Admit Type: Outpatient Age: 33 Room: [facility] ENDO ROOM 3 Gender: Female Note Status: Finalized Instrument Name: Upper Endoscope [id number] Procedure:             Upper GI endoscopy Indications:           Esophageal reflux symptoms that persist despite                         appropriate therapy Providers:             Toney Reil MD, MD Referring MD:          No Local Md, MD (Referring MD) Medicines:             General Anesthesia Complications:         No immediate complications. Estimated blood loss: None. Procedure:             Pre-Anesthesia Assessment:                        - Prior to the procedure, a History and Physical was                         performed, and patient medications and allergies were                         reviewed. The patient is competent. The risks and                         benefits of the procedure and the sedation options and                         risks were discussed with the patient. All questions                         were answered and informed consent was obtained.                         Patient identification and proposed procedure were                         verified by the physician, the nurse, the                         anesthesiologist, the anesthetist and the technician                         in the pre-procedure area in the procedure room in the                         endoscopy suite. Mental Status Examination: alert and                         oriented. Airway Examination: normal oropharyngeal                         airway and neck mobility. Respiratory Examination:  clear to auscultation. CV Examination: normal.                         Prophylactic Antibiotics: The patient does not require                         prophylactic  antibiotics. Prior Anticoagulants: The                         patient has taken no anticoagulant or antiplatelet                         agents. ASA Grade Assessment: II - A patient with mild                         systemic disease. After reviewing the risks and                         benefits, the patient was deemed in satisfactory                         condition to undergo the procedure. The anesthesia                         plan was to use general anesthesia. Immediately prior                         to administration of medications, the patient was                         re-assessed for adequacy to receive sedatives. The                         heart rate, respiratory rate, oxygen saturations,                         blood pressure, adequacy of pulmonary ventilation, and                         response to care were monitored throughout the                         procedure. The physical status of the patient was                         re-assessed after the procedure.                        After obtaining informed consent, the endoscope was                         passed under direct vision. Throughout the procedure,                         the patient's blood pressure, pulse, and oxygen                         saturations were monitored continuously. The Endoscope  was introduced through the mouth, and advanced to the                         second part of duodenum. The upper GI endoscopy was                         accomplished without difficulty. The patient tolerated                         the procedure well. Findings:      The gastroesophageal junction and examined esophagus were normal.       Biopsies were taken with a cold forceps for histology.      Esophagogastric landmarks were identified: the gastroesophageal junction       was found at 40 cm from the incisors.      The entire examined stomach was normal.      The cardia and gastric fundus  were normal on retroflexion.      The duodenal bulb and second portion of the duodenum were normal. Impression:            - Normal gastroesophageal junction and esophagus.                         Biopsied.                        - Esophagogastric landmarks identified.                        - Normal stomach.                        - Normal duodenal bulb and second portion of the                         duodenum. Recommendation:        - Await pathology results.                        - Proceed with colonoscopy as scheduled                        See colonoscopy report Procedure Code(s):     --- Professional ---                        [phone], Esophagogastroduodenoscopy, flexible,                         transoral; with biopsy, single or multiple Diagnosis Code(s):     --- Professional ---                        K21.9, Gastro-esophageal reflux disease without                         esophagitis CPT copyright 2022 American Medical Association. All rights reserved. The codes documented in this report are preliminary and upon coder review may  be revised to meet current compliance requirements. Dr. Libby Maw Toney Reil MD, MD 09/26/2023 11:58:09 AM This report has been signed electronically. Number of Addenda: 0 Note Initiated On: 09/26/2023 10:28 AM  Estimated Blood Loss:  Estimated blood loss: none.      [facility]

## 2023-09-26 NOTE — Transfer of Care (Signed)
 Immediate Anesthesia Transfer of Care Note  Patient: Kimberly Hardy  Procedure(s) Performed: ESOPHAGOGASTRODUODENOSCOPY (EGD) WITH PROPOFOL COLONOSCOPY WITH PROPOFOL  Patient Location: Endoscopy Unit  Anesthesia Type:General  Level of Consciousness: drowsy  Airway & Oxygen Therapy: Patient Spontanous Breathing  Post-op Assessment: Report given to RN and Post -op Vital signs reviewed and stable  Post vital signs: Reviewed and stable  Last Vitals:  Vitals Value Taken Time  BP 85/55 09/26/23 1212  Temp 36.1 C 09/26/23 1212  Pulse 75 09/26/23 1213  Resp 20 09/26/23 1213  SpO2 98 % 09/26/23 1213  Vitals shown include unfiled device data.  Last Pain:  Vitals:   09/26/23 1212  TempSrc: Temporal  PainSc: Asleep         Complications: No notable events documented.

## 2023-09-26 NOTE — Anesthesia Postprocedure Evaluation (Signed)
 Anesthesia Post Note  Patient: Kimberly Hardy  Procedure(s) Performed: ESOPHAGOGASTRODUODENOSCOPY (EGD) WITH PROPOFOL COLONOSCOPY WITH PROPOFOL  Patient location during evaluation: PACU Anesthesia Type: General Level of consciousness: awake Pain management: pain level controlled Vital Signs Assessment: post-procedure vital signs reviewed and stable Respiratory status: spontaneous breathing Cardiovascular status: stable Anesthetic complications: no   No notable events documented.   Last Vitals:  Vitals:   09/26/23 1224 09/26/23 1233  BP: 101/69 97/60  Pulse: 82 74  Resp: 14 15  Temp:    SpO2: 100% 100%    Last Pain:  Vitals:   09/26/23 1233  TempSrc:   PainSc: 0-No pain                 VAN STAVEREN,Brant Peets

## 2023-09-26 NOTE — Anesthesia Preprocedure Evaluation (Signed)
 Anesthesia Evaluation  Patient identified by MRN, date of birth, ID band Patient awake    Reviewed: Allergy & Precautions, NPO status , Patient's Chart, lab work & pertinent test results  Airway Mallampati: II  TM Distance: >3 FB Neck ROM: full    Dental  (+) Teeth Intact   Pulmonary neg pulmonary ROS   Pulmonary exam normal        Cardiovascular Exercise Tolerance: Good negative cardio ROS Normal cardiovascular exam Rhythm:Regular Rate:Normal     Neuro/Psych  Headaches  Anxiety     negative neurological ROS  negative psych ROS   GI/Hepatic negative GI ROS, Neg liver ROS,GERD  Medicated,,  Endo/Other  negative endocrine ROS    Renal/GU negative Renal ROS  negative genitourinary   Musculoskeletal   Abdominal Normal abdominal exam  (+)   Peds negative pediatric ROS (+)  Hematology negative hematology ROS (+)   Anesthesia Other Findings Past Medical History: No date: Anxiety No date: Constipation No date: Headache No date: Mental disorder     Comment:  Panic Attacks  History reviewed. No pertinent surgical history.     Reproductive/Obstetrics negative OB ROS                             Anesthesia Physical Anesthesia Plan  ASA: 2  Anesthesia Plan: General   Post-op Pain Management:    Induction: Intravenous  PONV Risk Score and Plan: Propofol infusion and TIVA  Airway Management Planned: Natural Airway and Nasal Cannula  Additional Equipment:   Intra-op Plan:   Post-operative Plan:   Informed Consent: I have reviewed the patients History and Physical, chart, labs and discussed the procedure including the risks, benefits and alternatives for the proposed anesthesia with the patient or authorized representative who has indicated his/her understanding and acceptance.     Dental Advisory Given  Plan Discussed with: CRNA  Anesthesia Plan Comments:         Anesthesia Quick Evaluation

## 2023-09-27 ENCOUNTER — Encounter: Payer: Self-pay | Admitting: Gastroenterology

## 2023-09-27 LAB — SURGICAL PATHOLOGY

## 2023-10-01 ENCOUNTER — Telehealth: Payer: Self-pay

## 2023-10-01 NOTE — Telephone Encounter (Signed)
-----   Message from [facility] sent at 09/30/2023  3:50 PM EDT ----- Please inform patient that the pathology results from upper endoscopy came back suggestive of acid reflux only.  Continue Dexilant 60 mg daily and antireflux lifestyle  RV

## 2023-10-01 NOTE — Telephone Encounter (Signed)
Tried to call patient but mailbox is full unable to leave a voicemail.  ?

## 2023-10-02 NOTE — Telephone Encounter (Signed)
 Patient verbalized understanding of results

## 2023-10-02 NOTE — Telephone Encounter (Signed)
 Called and left a message for call back

## 2023-10-02 NOTE — Telephone Encounter (Signed)
 Pt returning call requesting call back

## 2024-01-23 ENCOUNTER — Ambulatory Visit: Admitting: Dietician

## 2024-01-28 ENCOUNTER — Other Ambulatory Visit: Payer: Self-pay | Admitting: Physician Assistant

## 2024-01-28 DIAGNOSIS — K219 Gastro-esophageal reflux disease without esophagitis: Secondary | ICD-10-CM

## 2024-02-27 ENCOUNTER — Ambulatory Visit: Admitting: Dietician

## 2024-03-03 ENCOUNTER — Other Ambulatory Visit: Payer: Self-pay | Admitting: Physician Assistant

## 2024-03-03 DIAGNOSIS — K219 Gastro-esophageal reflux disease without esophagitis: Secondary | ICD-10-CM

## 2024-03-05 ENCOUNTER — Telehealth: Payer: Self-pay | Admitting: Physician Assistant

## 2024-03-05 DIAGNOSIS — K219 Gastro-esophageal reflux disease without esophagitis: Secondary | ICD-10-CM

## 2024-03-05 MED ORDER — DEXLANSOPRAZOLE 60 MG PO CPDR: 60.0000 mg | DELAYED_RELEASE_CAPSULE | Freq: Every day | ORAL | 0 refills | Status: DC

## 2024-03-05 NOTE — Telephone Encounter (Signed)
 Inbound call from patient requesting a refill on her Dexilant  60 MG capsules. Pl;ease advise.

## 2024-03-05 NOTE — Telephone Encounter (Signed)
 Left detailed message for patient explaining she has not been seen in our office and she will need to schedule an appointment for any future refills. Also, informed patient I will give her one refill until she schedules appt.

## 2024-03-05 NOTE — Telephone Encounter (Signed)
 Patient scheduled.

## 2024-03-16 ENCOUNTER — Ambulatory Visit: Admitting: Dietician

## 2024-03-19 ENCOUNTER — Ambulatory Visit: Admitting: Dietician

## 2024-03-27 ENCOUNTER — Other Ambulatory Visit: Payer: Self-pay | Admitting: Physician Assistant

## 2024-03-27 DIAGNOSIS — K219 Gastro-esophageal reflux disease without esophagitis: Secondary | ICD-10-CM

## 2024-04-03 NOTE — Progress Notes (Unsigned)
 Medical Nutrition Therapy  Appointment Start time:  1040  Appointment End time:  1133  Primary concerns today: GI symptoms of bloating, belching, burping, indigestion Referral diagnosis: Overweight, GERD, epigastric pain Preferred learning style: no preference indicated Learning readiness:  contemplating   NUTRITION ASSESSMENT   Clinical Medical Hx:  Past Medical History:  Diagnosis Date   Anxiety    Constipation    Headache    Mental disorder    Panic Attacks    Medications:  Current Outpatient Medications:    dexlansoprazole  (DEXILANT ) 60 MG capsule, Take 1 capsule (60 mg total) by mouth daily. CALL AND SCHEDULE APPT AT [facility] FOR ANY MORE REFILLS, Disp: 30 capsule, Rfl: 0   acetaminophen (TYLENOL) 325 MG tablet, Take by mouth., Disp: , Rfl:    clomiPRAMINE (ANAFRANIL) 50 MG capsule, Take 100 mg by mouth at bedtime., Disp: , Rfl:    sertraline (ZOLOFT) 100 MG tablet, Take 100 mg by mouth daily., Disp: , Rfl:    SODIUM FLUORIDE 5000 SENSITIVE 1.1-5 % GEL, PLEASE SEE ATTACHED FOR DETAILED DIRECTIONS, Disp: , Rfl:    Labs:  Wt Readings from Last 3 Encounters:  09/26/23 153 lb (69.4 kg)  04/15/23 154 lb 12.8 oz (70.2 kg)  09/06/21 162 lb 9.6 oz (73.8 kg)   Notable Signs/Symptoms:  BP Readings from Last 3 Encounters:  09/26/23 97/60  04/15/23 108/71  09/06/21 103/67   Lifestyle & Dietary Hx Pt presents today with her daughter. Pt c/o symptoms of bloating, belching, daily burping and indigestion. Pt reports she may be lactose intolerant but is unsure stating symptoms are increased at times and not at other times with intake of lactose containing foods. Pt reports she has not tried any dietary changes but has tried many OTC products with no relief. Pt reports symptoms are worse at night, stress increases her symptoms stating this is ongoing since high school. Pt c/o constipation and states bowel movements are couple times a week. Pt desires weight reduction to a lower BMI range.  Pt c/o OCD, ADHD and ongoing GI symptoms. 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 Pt reports she does cooking and shopping. Pt states she has access to a gym. Pt reports she eat out 2 weekly. All Pt's questions were answered during this encounter.   Estimated daily fluid intake: not enough 16.9-33.8 oz Supplements: miralax occasionally Sleep: 7 hours nightly  Stress / self-care: 8 out of 10 / self care includes: prayer, breathing exercises, playing with my daughter Current average weekly physical activity: 20 minutes walking the dog daily  24-Hr Dietary Recall First Meal: skips 2-3 d/w or 1 slice of potato bread or hawaiian roll or plain bagel with cream cheese, de-cafe tea with added sugar or ginger-ale Snack: none Second Meal: 11 a-2 p: Mcdonald[employer] 2 cheeseburger, large fries, un sweet tea or chicken nuggets, macaroni and cheese, greens beans, decafe tea with added sugar or ginger-ale Snack: nabs  Third Meal: noodles, beef red sauce, onions, green peppers, zucchini or chicken, noodles, alfredo or rice, soy sauce, chicken, zucchini, squash, carrots, decafe tea with added sugar Snack:  cereal with 2% milk Beverages: decafe tea with added sugar or ginger-ale, water, 2%  NUTRITION DIAGNOSIS  NB-1.1 Food and nutrition-related knowledge deficit As related to no prior nutrition education .  As evidenced by Pt's reports and dietary recall.  NUTRITION INTERVENTION  Nutrition education (E-1) on the following topics:  Fruits & Vegetables: Aim to fill half your plate with a variety of fruits  and vegetables. They are rich in vitamins, minerals, and fiber, and can help reduce the risk of chronic diseases. Choose a colorful assortment of fruits and vegetables to ensure you get a wide range of nutrients. Cooked versus raw intake of frits and vegetables may benefit GERD symptoms. Grains and Starches: Make at least half of your grain choices whole grains, such as brown rice, whole wheat bread, and oats.  Whole grains provide fiber, which aids in digestion and healthy cholesterol levels. Aim for whole forms of starchy vegetables such as potatoes, sweet potatoes, beans, peas, and corn, which are fiber rich and provide many vitamins and minerals.  Protein: Incorporate lean sources of protein, such as poultry, fish, beans, nuts, and seeds, into your meals. Protein is essential for building and repairing tissues, staying full, balancing blood sugar, as well as supporting immune function. Dairy: Include low-fat or fat-free dairy products like milk, yogurt, and cheese in your diet as tolerated. Dairy foods are excellent sources of calcium and vitamin D, which are crucial for bone health. Consider lactose free products as desired.  Physical Activity: Aim for 60 minutes of physical activity daily. Regular physical activity promotes overall health-including helping to reduce risk for heart disease and diabetes, promoting mental health, and helping us  sleep better.    Handouts Provided Include  GERD MNT-AND Move Your Way- DHHS Plate Planner- [employer]  Learning Style & Readiness for Change Teaching method utilized: Visual & Auditory  Demonstrated degree of understanding via: Teach Back  Barriers to learning/adherence to lifestyle change: unknown  Goals Established by Pt Increase water intake aiming for 64-90 ounces daily  Journaling dietary intake with symptoms Avoid caffeine and other trigger foods  MONITORING & EVALUATION Dietary intake, weekly physical activity, dietary journal   Next Steps  Patient is to return PRN.

## 2024-04-06 ENCOUNTER — Encounter: Attending: Nurse Practitioner | Admitting: Dietician

## 2024-04-06 DIAGNOSIS — K219 Gastro-esophageal reflux disease without esophagitis: Secondary | ICD-10-CM | POA: Diagnosis not present

## 2024-04-06 DIAGNOSIS — E663 Overweight: Secondary | ICD-10-CM | POA: Insufficient documentation

## 2024-04-06 DIAGNOSIS — R1013 Epigastric pain: Secondary | ICD-10-CM | POA: Insufficient documentation

## 2024-04-07 ENCOUNTER — Other Ambulatory Visit: Payer: Self-pay | Admitting: Physician Assistant

## 2024-04-07 DIAGNOSIS — K219 Gastro-esophageal reflux disease without esophagitis: Secondary | ICD-10-CM

## 2024-04-23 ENCOUNTER — Ambulatory Visit: Admitting: Dietician

## 2024-04-28 ENCOUNTER — Ambulatory Visit: Admitting: Physician Assistant

## 2024-04-28 NOTE — Progress Notes (Deleted)
 Ellouise Console, PA-C [address] [place], [place]  [address] Phone: [phone]   Gastroenterology Consultation  Referring Provider:     No ref. provider found Primary Care Physician:  Patient, No Pcp Per Primary Gastroenterologist:  Ellouise Console, PA-C / *** Reason for Consultation:     Medication refill, follow-up GERD        HPI:   Discussed the use of AI scribe software for clinical note transcription with the patient, who gave verbal consent to proceed.  Here for annual follow-up of GERD.  Needs medication refill.  Taking Dexilant  60 mg daily.  I last saw patient at [facility] 03/2023 for GERD symptoms.  She was taking pantoprazole 40 mg twice daily which did not control her acid reflux.  She also previously tried and failed Prilosec, Prevacid, Nexium, and Protonix.  History of Present Illness   09/2023 colonoscopy by Dr. Unk: Normal.  09/2023 EGD by Dr. Unk: Normal.  Esophageal biopsies consistent with acid reflux.  No evidence of EOE or Barrett's.  Patient has history of chronic GERD and irritable bowel syndrome.  History of chronic abdominal and pelvic pain.  Previously saw gastroenterologist through [facility] in 2017. Abdominal pelvic CT 12/2018 showed no acute abnormality.   EGD 01/2016, Dr. Gala at [facility]:  Report not available.   EGD 10/2015 Dr. Theodoro at [facility]: Gastritis, LA grade A esophagitis.   Colonoscopy 10/2015 at [facility]: Polyps, hemorrhoids    Past Medical History:  Diagnosis Date   Anxiety    Constipation    Headache    Mental disorder    Panic Attacks    Past Surgical History:  Procedure Laterality Date   COLONOSCOPY WITH PROPOFOL  N/A 09/26/2023   Procedure: COLONOSCOPY WITH PROPOFOL ;  Surgeon: Unk Corinn Skiff, MD;  Location: [facility] ENDOSCOPY;  Service: Endoscopy;  Laterality: N/A;   ESOPHAGOGASTRODUODENOSCOPY (EGD) WITH PROPOFOL  N/A 09/26/2023   Procedure: ESOPHAGOGASTRODUODENOSCOPY (EGD) WITH PROPOFOL ;  Surgeon: Unk Corinn Skiff,  MD;  Location: [facility] ENDOSCOPY;  Service: Endoscopy;  Laterality: N/A;    Prior to Admission medications   Medication Sig Start Date End Date Taking? Authorizing Provider  acetaminophen (TYLENOL) 325 MG tablet Take by mouth. 03/28/22   [provider]  clomiPRAMINE (ANAFRANIL) 50 MG capsule Take 100 mg by mouth at bedtime. 06/20/23   [provider]  dexlansoprazole  (DEXILANT ) 60 MG capsule TAKE 1 CAPSULE (60 MG TOTAL) BY MOUTH DAILY. CALL AND SCHEDULE APPT AT [facility] FOR ANY MORE REFILLS 04/07/24   Console Ellouise, PA-C  sertraline (ZOLOFT) 100 MG tablet Take 100 mg by mouth daily.    [provider]  SODIUM FLUORIDE 5000 SENSITIVE 1.1-5 % GEL PLEASE SEE ATTACHED FOR DETAILED DIRECTIONS 01/30/23   [provider]    Family History  Problem Relation Age of Onset   Diabetes Mother    Heart disease Father    Arthritis Maternal Grandmother    Heart disease Paternal Grandmother    Heart disease Paternal Grandfather      Social History   Tobacco Use   Smoking status: Never   Smokeless tobacco: Never   Tobacco comments:    Per patient is exposed daily to secondhand smoke.  Vaping Use   Vaping status: Every Day  Substance Use Topics   Alcohol use: Not Currently    Comment: Last use 06/2021   Drug use: Never    Allergies as of 04/28/2024 - Review Complete 04/06/2024  Allergen Reaction Noted   Oxycodone-acetaminophen Nausea Only  10/18/2015    Review of Systems:    All systems reviewed and negative except where noted in HPI.   Physical Exam:  There were no vitals taken for this visit. No LMP recorded.  General:   Alert,  Well-developed, well-nourished, pleasant and cooperative in NAD Lungs:  Respirations even and unlabored.  Clear throughout to auscultation.   No wheezes, crackles, or rhonchi. No acute distress. Heart:  Regular rate and rhythm; no murmurs, clicks, rubs, or gallops. Abdomen:  Normal bowel sounds.  No bruits.  Soft, and non-distended  without masses, hepatosplenomegaly or hernias noted.  No Tenderness.  No guarding or rebound tenderness.    Neurologic:  Alert and oriented x3;  grossly normal neurologically. Psych:  Alert and cooperative. Normal mood and affect.   Imaging Studies: No results found.  Labs: CBC    Component Value Date/Time   WBC 9.4 09/06/2021 1040   WBC 8.6 04/01/2020 1319   RBC 4.05 09/06/2021 1040   RBC 4.18 04/01/2020 1319   HGB 12.8 09/06/2021 1040   HCT 37.7 09/06/2021 1040   PLT 268 09/06/2021 1040   MCV 93 09/06/2021 1040    CMP     Component Value Date/Time   NA 136 04/01/2020 1319   K 3.6 04/01/2020 1319   CL 102 04/01/2020 1319   CO2 25 04/01/2020 1319   GLUCOSE 92 04/01/2020 1319   BUN 9 04/01/2020 1319   CREATININE 0.83 04/01/2020 1319   CALCIUM 9.3 04/01/2020 1319   PROT 7.6 04/01/2020 1319   ALBUMIN 4.3 04/01/2020 1319   AST 20 04/01/2020 1319   ALT 27 04/01/2020 1319   ALKPHOS 92 04/01/2020 1319   BILITOT 0.7 04/01/2020 1319   GFRNONAA >60 04/01/2020 1319    Assessment and Plan:   Kimberly Hardy is a 33 y.o. y/o female has been referred for   1.  Chronic GERD controlled on Dexilant .  She has tried and failed multiple other PPIs. - Refill Dexilant  60 mg daily  Assessment and Plan Assessment & Plan       Follow up ***  Ellouise Console, PA-C

## 2024-05-05 ENCOUNTER — Other Ambulatory Visit: Payer: Self-pay
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
## 2024-05-07 ENCOUNTER — Ambulatory Visit: Admitting: Family Medicine

## 2024-05-15 ENCOUNTER — Other Ambulatory Visit: Payer: Self-pay | Admitting: Physician Assistant

## 2024-05-15 DIAGNOSIS — K219 Gastro-esophageal reflux disease without esophagitis: Secondary | ICD-10-CM

## 2024-05-28 ENCOUNTER — Other Ambulatory Visit: Payer: Self-pay

## 2024-05-28 ENCOUNTER — Telehealth: Payer: Self-pay

## 2024-05-28 ENCOUNTER — Other Ambulatory Visit: Payer: Self-pay | Admitting: Family Medicine

## 2024-05-28 DIAGNOSIS — K219 Gastro-esophageal reflux disease without esophagitis: Secondary | ICD-10-CM

## 2024-05-28 MED ORDER — DEXLANSOPRAZOLE 60 MG PO CPDR: 60.0000 mg | DELAYED_RELEASE_CAPSULE | Freq: Every day | ORAL | 0 refills | Status: DC

## 2024-05-28 NOTE — Telephone Encounter (Signed)
 Pharmacy contacted office to request patient's rx for Prevacid to be sent to a different pharmacy.  Per pharmacist at [facility], Pt is banned from having rx's with any [facility].  Unable to contact patient by phone to request a different pharmacy.  Mychart message has been sent to patient to call office.  Thanks,  D'Lo, CMA

## 2024-05-28 NOTE — Progress Notes (Signed)
 30 day supply of Dexilant  sent in. Patient has appointment with me 06/22/2024.

## 2024-05-28 NOTE — Telephone Encounter (Signed)
 Per pt would like to know if she can get a refill on Dexilante 60mg  before seeing md.

## 2024-06-17 ENCOUNTER — Other Ambulatory Visit: Payer: Self-pay
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
## 2024-06-22 ENCOUNTER — Other Ambulatory Visit: Payer: Self-pay | Admitting: Medical Genetics

## 2024-06-22 ENCOUNTER — Ambulatory Visit: Admitting: Family Medicine

## 2024-06-23 ENCOUNTER — Encounter (INDEPENDENT_AMBULATORY_CARE_PROVIDER_SITE_OTHER): Payer: Self-pay

## 2024-06-26 ENCOUNTER — Other Ambulatory Visit: Payer: Self-pay | Admitting: Family Medicine

## 2024-06-26 DIAGNOSIS — K219 Gastro-esophageal reflux disease without esophagitis: Secondary | ICD-10-CM

## 2024-06-27 ENCOUNTER — Other Ambulatory Visit: Payer: Self-pay | Attending: Medical Genetics

## 2024-07-24 ENCOUNTER — Other Ambulatory Visit: Payer: Self-pay | Admitting: Family Medicine

## 2024-07-24 DIAGNOSIS — K219 Gastro-esophageal reflux disease without esophagitis: Secondary | ICD-10-CM

## 2024-08-04 ENCOUNTER — Ambulatory Visit: Admitting: Family Medicine
